# Patient Record
Sex: Female | Born: 1950 | Race: White | Hispanic: No | Smoking: Current some day smoker
Health system: Southern US, Community
[De-identification: ages and names within clinical notes are randomized; demographics above are authoritative.]

## PROBLEM LIST (undated history)

## (undated) ENCOUNTER — Emergency Department: Admission: EM | Payer: Medicare Other | Source: Home / Self Care

## (undated) DIAGNOSIS — K219 Gastro-esophageal reflux disease without esophagitis: Secondary | ICD-10-CM

## (undated) DIAGNOSIS — I10 Essential (primary) hypertension: Secondary | ICD-10-CM

## (undated) DIAGNOSIS — E785 Hyperlipidemia, unspecified: Secondary | ICD-10-CM

## (undated) DIAGNOSIS — M1990 Unspecified osteoarthritis, unspecified site: Secondary | ICD-10-CM

## (undated) DIAGNOSIS — I2510 Atherosclerotic heart disease of native coronary artery without angina pectoris: Secondary | ICD-10-CM

## (undated) DIAGNOSIS — C801 Malignant (primary) neoplasm, unspecified: Secondary | ICD-10-CM

## (undated) HISTORY — PX: TOTAL KNEE ARTHROPLASTY: SHX125

## (undated) HISTORY — PX: CHOLECYSTECTOMY: SHX55

## (undated) HISTORY — PX: APPENDECTOMY: SHX54

## (undated) HISTORY — PX: CARDIAC CATHETERIZATION: SHX172

## (undated) NOTE — Telephone Encounter (Signed)
Formatting of this note might be different from the original.  I attempted to contact the patient to schedule their Medicare Annual Wellness Visit (AWV) virtually, by telephone, or face-to-face. No answer, voicemail not set up.     Donnie Mesa, [place]  [phone]- [phone]  Electronically signed by Wilson Singer, CMA at 12/04/2022 12:59 PM EST

---

## 2014-05-05 ENCOUNTER — Ambulatory Visit (INDEPENDENT_AMBULATORY_CARE_PROVIDER_SITE_OTHER): Payer: Federal, State, Local not specified - PPO | Admitting: Family Medicine

## 2014-05-05 ENCOUNTER — Ambulatory Visit (INDEPENDENT_AMBULATORY_CARE_PROVIDER_SITE_OTHER)
Admission: RE | Admit: 2014-05-05 | Discharge: 2014-05-05 | Disposition: A | Discharge status: Home / Self Care | Payer: Federal, State, Local not specified - PPO | Source: Ambulatory Visit | Attending: Family Medicine | Admitting: Family Medicine

## 2014-05-05 VITALS — BP 122/82 | HR 68 | Wt 167.0 lb

## 2014-05-05 DIAGNOSIS — M549 Dorsalgia, unspecified: Secondary | ICD-10-CM

## 2014-05-05 DIAGNOSIS — M545 Low back pain, unspecified: Secondary | ICD-10-CM | POA: Insufficient documentation

## 2014-05-05 NOTE — Assessment & Plan Note (Signed)
Patient's low back pain is likely multifactorial. Patient does have poor core strengthening is overweight, we also discussed patient having some mild degenerative disc disease. Patient given home exercise program, over-the-counter medications he can be beneficial. Patient is getting narcotics from another provider and we discussed her to limit this as much as possible. We discussed an icing protocol as well. Patient will try these interventions and then come back again in 3 weeks. Patient continues to have trouble we can either consider advanced imaging or osteopathic manipulation. We'll discuss further at next evaluation.

## 2014-05-05 NOTE — Patient Instructions (Addendum)
Very nice to meet you Try exercises most days of the week.  Ice 20 minutes after activity.  Take tylenol 650 mg three times a day is the best evidence based medicine we have for arthritis.  Glucosamine sulfate 750mg  twice a day is a supplement that has been shown to help moderate to severe arthritis. Vitamin D 2000 IU daily Fish oil 2 grams daily.  Tumeric 500mg  twice daily.  Capsaicin topically up to four times a day may also help with pain. It's important that you continue to stay active. Controlling your weight is important.  Consider physical therapy to strengthen muscles around the joint that hurts to take pressure off of the joint itself. Water aerobics and cycling with low resistance are the best two types of exercise for arthritis. Come back and see me in 3-4 weeks.

## 2014-05-05 NOTE — Progress Notes (Signed)
[facility] [address] [place], [place] [address] Phone: [phone] Subjective:    I'm seeing this patient by the request  of:  Leward Quan MD  CC: Low back pain  [id number] Kelly Sanchez is a 63 y.o. female coming in with complaint of low back pain. Patient states she has had this pain for multiple years but has gotten worse with increasing frequency and duration over the course of the last couple months. Patient states that it is difficult standing at work for a long period of time secondary to the pain. Patient states that she starts moving she actually feels better overall. Patient denies any significant radiation into her legs but states that she can have a tingling feeling in her right leg if she stands for too long. Patient denies any weakness wearing complete numbness of the legs. Patient states that the pain can give her trouble she tries to extend her back such as lying down at night. Patient has tried some over-the-counter medicines as well as Percocet that she gets from another provider that does help at night. Patient rates the severity of 9/10 but is able to still do all her activities of daily living as well as work. No history of any true injury to this area. Patient though does have a past medical history significant for cervical disc disease status post epidural injections.   X-rays were ordered with the and interpreted by me today. X-rays the patient's lumbar spine did not show anything other than some mild degenerative disc disease. Over read pending    Past medical history, social, surgical and family history all reviewed in electronic medical record.   Review of Systems: No headache, visual changes, nausea, vomiting, diarrhea, constipation, dizziness, abdominal pain, skin rash, fevers, chills, night sweats, weight loss, swollen lymph nodes, body aches, joint swelling, muscle aches, chest pain, shortness of breath, mood changes.    Objective Blood pressure 122/82, pulse 68, weight 167 lb (75.751 kg), SpO2 97.00%.  General: No apparent distress alert and oriented x3 mood and affect normal, dressed appropriately. Obese.  HEENT: Pupils equal, extraocular movements intact  Respiratory: Patient's speak in full sentences and does not appear short of breath  Cardiovascular: No lower extremity edema, non tender, no erythema  Skin: Warm dry intact with no signs of infection or rash on extremities or on axial skeleton.  Abdomen: Soft nontender  Neuro: Cranial nerves II through XII are intact, neurovascularly intact in all extremities with 2+ DTRs and 2+ pulses.  Lymph: No lymphadenopathy of posterior or anterior cervical chain or axillae bilaterally.  Gait normal with good balance and coordination.  MSK:  Non tender with full range of motion and good stability and symmetric strength and tone of shoulders, elbows, wrist, hip, knee and ankles bilaterally.  Back Exam:  Inspection: Unremarkable  Motion: Flexion 35 deg, Extension 35 deg, Side Bending to 45 deg bilaterally,  Rotation to 45 deg bilaterally  SLR laying: Negative  XSLR laying: Negative  Palpable tenderness: Mild tenderness mostly over the right-sided paraspinal musculature from L2-L4.Marland Kitchen FABER: negative. Sensory change: Gross sensation intact to all lumbar and sacral dermatomes.  Reflexes: 2+ at both patellar tendons, 2+ at achilles tendons, Babinski's downgoing.  Strength at foot  Plantar-flexion: 5/5 Dorsi-flexion: 5/5 Eversion: 5/5 Inversion: 5/5  Leg strength  Quad: 5/5 Hamstring: 5/5 Hip flexor: 5/5 Hip abductors: 5/5  Gait unremarkable.     Impression and Recommendations:     This case required medical decision making  of moderate complexity.

## 2014-05-08 ENCOUNTER — Emergency Department: Payer: Self-pay | Admitting: Emergency Medicine

## 2014-05-08 LAB — COMPREHENSIVE METABOLIC PANEL
Albumin: 4.1 g/dL (ref 3.4–5.0)
Alkaline Phosphatase: 101 U/L
Anion Gap: 5 — ABNORMAL LOW (ref 7–16)
BUN: 15 mg/dL (ref 7–18)
Bilirubin,Total: 0.4 mg/dL (ref 0.2–1.0)
Calcium, Total: 9.4 mg/dL (ref 8.5–10.1)
Chloride: 109 mmol/L — ABNORMAL HIGH (ref 98–107)
Co2: 27 mmol/L (ref 21–32)
Creatinine: 1.17 mg/dL (ref 0.60–1.30)
EGFR (African American): 58 — ABNORMAL LOW
EGFR (Non-African Amer.): 50 — ABNORMAL LOW
Glucose: 94 mg/dL (ref 65–99)
Osmolality: 282 (ref 275–301)
Potassium: 4.3 mmol/L (ref 3.5–5.1)
SGOT(AST): 24 U/L (ref 15–37)
SGPT (ALT): 38 U/L (ref 12–78)
Sodium: 141 mmol/L (ref 136–145)
Total Protein: 7.7 g/dL (ref 6.4–8.2)

## 2014-05-08 LAB — CBC WITH DIFFERENTIAL/PLATELET
Basophil #: 0.1 10*3/uL (ref 0.0–0.1)
Basophil %: 0.9 %
Eosinophil #: 0.1 10*3/uL (ref 0.0–0.7)
Eosinophil %: 1.7 %
HCT: 40.8 % (ref 35.0–47.0)
HGB: 13.8 g/dL (ref 12.0–16.0)
Lymphocyte #: 3.7 10*3/uL — ABNORMAL HIGH (ref 1.0–3.6)
Lymphocyte %: 42.8 %
MCH: 30.1 pg (ref 26.0–34.0)
MCHC: 33.9 g/dL (ref 32.0–36.0)
MCV: 89 fL (ref 80–100)
Monocyte #: 0.5 x10 3/mm (ref 0.2–0.9)
Monocyte %: 5.2 %
Neutrophil #: 4.3 10*3/uL (ref 1.4–6.5)
Neutrophil %: 49.4 %
Platelet: 227 10*3/uL (ref 150–440)
RBC: 4.59 10*6/uL (ref 3.80–5.20)
RDW: 14.7 % — ABNORMAL HIGH (ref 11.5–14.5)
WBC: 8.7 10*3/uL (ref 3.6–11.0)

## 2014-05-08 LAB — URINALYSIS, COMPLETE
Bacteria: NONE SEEN
Bilirubin,UR: NEGATIVE
Glucose,UR: NEGATIVE mg/dL (ref 0–75)
Hyaline Cast: 2
Ketone: NEGATIVE
Nitrite: NEGATIVE
Ph: 5 (ref 4.5–8.0)
Protein: NEGATIVE
RBC,UR: 1 /HPF (ref 0–5)
Specific Gravity: 1.015 (ref 1.003–1.030)
Squamous Epithelial: 1
WBC UR: 2 /HPF (ref 0–5)

## 2014-05-08 LAB — LIPASE, BLOOD: Lipase: 474 U/L — ABNORMAL HIGH (ref 73–393)

## 2014-05-26 ENCOUNTER — Ambulatory Visit: Payer: Federal, State, Local not specified - PPO | Admitting: Family Medicine

## 2014-05-26 DIAGNOSIS — Z0289 Encounter for other administrative examinations: Secondary | ICD-10-CM

## 2015-04-26 ENCOUNTER — Emergency Department: Payer: Federal, State, Local not specified - PPO

## 2015-04-26 ENCOUNTER — Encounter: Payer: Self-pay | Admitting: Emergency Medicine

## 2015-04-26 ENCOUNTER — Emergency Department
Admission: EM | Admit: 2015-04-26 | Discharge: 2015-04-26 | Disposition: A | Discharge status: Home / Self Care | Payer: Federal, State, Local not specified - PPO | Attending: Emergency Medicine | Admitting: Emergency Medicine

## 2015-04-26 DIAGNOSIS — M7121 Synovial cyst of popliteal space [Baker], right knee: Secondary | ICD-10-CM

## 2015-04-26 DIAGNOSIS — R2241 Localized swelling, mass and lump, right lower limb: Secondary | ICD-10-CM | POA: Diagnosis present

## 2015-04-26 DIAGNOSIS — R609 Edema, unspecified: Secondary | ICD-10-CM

## 2015-04-26 HISTORY — DX: Malignant (primary) neoplasm, unspecified: C80.1

## 2015-04-26 HISTORY — DX: Unspecified osteoarthritis, unspecified site: M19.90

## 2015-04-26 MED ORDER — TRAMADOL HCL 50 MG PO TABS: 50.0000 mg | ORAL_TABLET | Freq: Four times a day (QID) | ORAL | Status: AC | PRN

## 2015-04-26 NOTE — ED Notes (Signed)
Pt signed d/c instructions due to NO WRITING PAD in treatment room 30.

## 2015-04-26 NOTE — ED Notes (Signed)
Pt in with co right lower leg pain since yest sent from urgent care to rule out dvt

## 2015-04-26 NOTE — Discharge Instructions (Signed)
Gentzler Cyst °A Meinders cyst is a sac-like structure that forms in the back of the knee. It is filled with the same fluid that is located in your knee. This fluid lubricates the bones and cartilage of the knee and allows them to move over each other more easily. °CAUSES  °When the knee becomes injured or inflamed, increased fluid forms in the knee. When this happens, the joint lining is pushed out behind the knee and forms the Muto cyst. This cyst may also be caused by inflammation from arthritic conditions and infections. °SIGNS AND SYMPTOMS  °A Engelbrecht cyst usually has no symptoms. When the cyst is substantially enlarged: °· You may feel pressure behind the knee, stiffness in the knee, or a mass in the area behind the knee. °· You may develop pain, redness, and swelling in the calf.  This can suggest a blood clot and requires evaluation by your health care provider. °DIAGNOSIS  °A Laube cyst is most often found during an ultrasound exam. This exam may have been performed for other reasons, and the cyst was found incidentally. Sometimes an MRI is used. This picks up other problems within a joint that an ultrasound exam may not. If the Kaufman cyst developed immediately after an injury, X-ray exams may be used to diagnose the cyst. °TREATMENT  °The treatment depends on the cause of the cyst. Anti-inflammatory medicines and rest often will be prescribed. If the cyst is caused by a bacterial infection, antibiotic medicines may be prescribed.  °HOME CARE INSTRUCTIONS  °· If the cyst was caused by an injury, for the first 24 hours, keep the injured leg elevated on 2 pillows while lying down. °· For the first 24 hours while you are awake, apply ice to the injured area: °¨ Put ice in a plastic bag. °¨ Place a towel between your skin and the bag. °¨ Leave the ice on for 20 minutes, 2-3 times a day. °· Only take over-the-counter or prescription medicines for pain, discomfort, or fever as directed by your health care  provider. °· Only take antibiotic medicine as directed. Make sure to finish it even if you start to feel better. °MAKE SURE YOU:  °· Understand these instructions. °· Will watch your condition. °· Will get help right away if you are not doing well or get worse. °Document Released: 12/11/2005 Document Revised: 10/01/2013 Document Reviewed: 07/23/2013 °ExitCare® Patient Information ©2015 [employer]. This information is not intended to replace advice given to you by your health care provider. Make sure you discuss any questions you have with your health care provider. ° °

## 2015-04-26 NOTE — ED Notes (Signed)
Patient with no complaints at this time. Respirations even and unlabored. Skin warm/dry. Discharge instructions reviewed with patient at this time. Patient given opportunity to voice concerns/ask questions. Patient discharged at this time and left Emergency Department with steady gait.   

## 2015-04-26 NOTE — ED Provider Notes (Signed)
[facility] Emergency Department Provider Note   Time seen: 9:48 PM  I have reviewed the triage vital signs and the nursing notes.   HISTORY  Chief Complaint Leg Swelling    HPI Kelly Sanchez is a 64 y.o. female who presents with right lower leg pain and was sent from urgent care to rule out DVT. Get an ultrasound she was sent here she's gross pain as severe earlier mild currently. 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 Pain is mostly on the right knee is dull     No past medical history on file.  Patient Active Problem List   Diagnosis Date Noted  . Low back pain 05/05/2014    No past surgical history on file.  No current outpatient prescriptions on file.  Allergies Review of patient's allergies indicates no known allergies.  No family history on file.  Social History History  Substance Use Topics  . Smoking status: Not on file  . Smokeless tobacco: Not on file  . Alcohol Use: Not on file    Review of Systems Constitutional: Negative for fever. Eyes: Negative for visual changes. ENT: Negative for sore throat. Cardiovascular: Negative for chest pain. Respiratory: Negative for shortness of breath. Gastrointestinal: Negative for abdominal pain, vomiting and diarrhea. Genitourinary: Negative for dysuria. Musculoskeletal: Negative for back pain. Skin: Negative for rash. Neurological: Negative for headaches, focal weakness or numbness.  10-point ROS otherwise negative.  ____________________________________________   PHYSICAL EXAM:  VITAL SIGNS: ED Triage Vitals  Enc Vitals Group     BP 04/26/15 2023 132/75 mmHg     Pulse Rate 04/26/15 2023 72     Resp --      Temp 04/26/15 2023 97.9 F (36.6 C)     Temp Source 04/26/15 2023 Oral     SpO2 04/26/15 2023 97 %     Weight 04/26/15 2017 177 lb (80.287 kg)     Height 04/26/15 2017 5\' 2"  (1.575 m)     Head Cir --      Peak Flow --      Pain Score 04/26/15 2017 10   Pain Loc --      Pain Edu? --      Excl. in [place]? --     Constitutional: Alert and oriented. Well appearing and in no distress.  ENT   Head: Normocephalic and atraumatic.    Mouth/Throat: Mucous membranes are moist.   Neck: No stridor.   Gastrointestinal: Soft and nontender. No distention. No abdominal bruits. There is no CVA tenderness.  Musculoskeletal: Nontender with normal range of motion in all extremities. No joint effusions.  No lower extremity tenderness nor edema. Neurologic:  Normal speech and language. No gross focal neurologic deficits are appreciated. Speech is normal. No gait instability. Skin:  Skin is warm, dry and intact. No rash noted. Psychiatric: Mood and affect are normal. Speech and behavior are normal. Patient exhibits appropriate insight and judgment.        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  Impression Callender's cyst  Next  Plan would be NSAIDs and orthopedics follow-up. Patient presents plan stable for discharge   Earleen Newport,  MD   Earleen Newport, MD 04/26/15 2151

## 2019-01-16 ENCOUNTER — Encounter: Payer: Self-pay | Admitting: Orthopaedic Surgery

## 2019-01-16 ENCOUNTER — Ambulatory Visit (INDEPENDENT_AMBULATORY_CARE_PROVIDER_SITE_OTHER): Payer: Self-pay | Admitting: Orthopaedic Surgery

## 2019-01-16 VITALS — BP 147/89 | HR 85 | Ht 62.5 in | Wt 146.0 lb

## 2019-01-16 DIAGNOSIS — Z96652 Presence of left artificial knee joint: Secondary | ICD-10-CM

## 2019-01-16 NOTE — Progress Notes (Signed)
Patient seen for impairment rating recorded elsewhere.

## 2019-10-09 ENCOUNTER — Emergency Department: Payer: Medicare Other

## 2019-10-09 ENCOUNTER — Emergency Department
Admission: EM | Admit: 2019-10-09 | Discharge: 2019-10-09 | Disposition: A | Discharge status: Home / Self Care | Payer: Medicare Other | Attending: Emergency Medicine | Admitting: Emergency Medicine

## 2019-10-09 ENCOUNTER — Encounter: Payer: Self-pay | Admitting: Emergency Medicine

## 2019-10-09 ENCOUNTER — Other Ambulatory Visit: Payer: Self-pay

## 2019-10-09 DIAGNOSIS — Y939 Activity, unspecified: Secondary | ICD-10-CM | POA: Insufficient documentation

## 2019-10-09 DIAGNOSIS — Z79899 Other long term (current) drug therapy: Secondary | ICD-10-CM | POA: Insufficient documentation

## 2019-10-09 DIAGNOSIS — F17210 Nicotine dependence, cigarettes, uncomplicated: Secondary | ICD-10-CM | POA: Insufficient documentation

## 2019-10-09 DIAGNOSIS — Y999 Unspecified external cause status: Secondary | ICD-10-CM | POA: Diagnosis not present

## 2019-10-09 DIAGNOSIS — Y929 Unspecified place or not applicable: Secondary | ICD-10-CM | POA: Diagnosis not present

## 2019-10-09 DIAGNOSIS — W010XXA Fall on same level from slipping, tripping and stumbling without subsequent striking against object, initial encounter: Secondary | ICD-10-CM | POA: Diagnosis not present

## 2019-10-09 DIAGNOSIS — S299XXA Unspecified injury of thorax, initial encounter: Secondary | ICD-10-CM | POA: Diagnosis not present

## 2019-10-09 MED ORDER — KETOROLAC TROMETHAMINE 30 MG/ML IJ SOLN: 30.0000 mg | Freq: Once | INTRAMUSCULAR | Status: DC

## 2019-10-09 MED ORDER — OXYCODONE-ACETAMINOPHEN 5-325 MG PO TABS: 1.0000 | ORAL_TABLET | ORAL | 0 refills | Status: DC | PRN

## 2019-10-09 MED ORDER — OXYCODONE-ACETAMINOPHEN 5-325 MG PO TABS
1.0000 | ORAL_TABLET | Freq: Once | ORAL | Status: AC
Start: 2019-10-09 — End: 2019-10-09
  Administered 2019-10-09: 1 via ORAL
  Filled 2019-10-09: qty 1

## 2019-10-09 NOTE — ED Notes (Signed)
Incentive spirometer provided for patient with education on proper use.

## 2019-10-09 NOTE — ED Triage Notes (Signed)
Patient ambulatory to triage with steady gait, without difficulty or distress noted, mask in place; pt reports slipping on porch PTA; c/o pain to rt lateral ribcage; denies any other c/o or injuries

## 2019-10-09 NOTE — ED Provider Notes (Signed)
[facility] Emergency Department Provider Note    First MD Initiated Contact with Patient 10/09/19 0320     (approximate)  I have reviewed the triage vital signs and the nursing notes.   HISTORY  Chief Complaint Rib Injury   HPI Kelly Sanchez is a 68 y.o. female presents to the emergency department history of accidental slip and fall with resultant right lateral chest wall discomfort.  Patient states that she felt a "pop" her ribs when she fell.  Patient admits to pain with palpation and deep inspiration.  Patient denies any head injury no loss of consciousness.        Past Medical History:  Diagnosis Date  . Arthritis   . Cancer [facility])     Patient Active Problem List   Diagnosis Date Noted  . Low back pain 05/05/2014    Past Surgical History:  Procedure Laterality Date  . APPENDECTOMY    . CHOLECYSTECTOMY      Prior to Admission medications   Medication Sig Start Date End Date Taking? Authorizing Provider  aspirin 81 MG chewable tablet Chew by mouth.    [provider]  atorvastatin (LIPITOR) 80 MG tablet Take by mouth. 09/04/18   [provider]  gabapentin (NEURONTIN) 300 MG capsule Take by mouth. 09/04/18 09/04/19  [provider]  metoprolol succinate (TOPROL-XL) 25 MG 24 hr tablet Take by mouth. 09/04/18   [provider]  oxyCODONE-acetaminophen (PERCOCET) 5-325 MG tablet Take 1 tablet by mouth every 4 (four) hours as needed. 10/09/19 10/08/20  Gregor Hams, MD  pantoprazole (PROTONIX) 40 MG tablet Take by mouth. 12/27/18 12/27/19  [provider]    Allergies Patient has no known allergies.  Family History  Problem Relation Age of Onset  . Dementia Mother   . Heart attack Father     Social History Social History   Tobacco Use  . Smoking status: Current Every Day Smoker  . Smokeless tobacco: Never Used  Substance Use Topics  . Alcohol use: No  . Drug use: No    Review of  Systems Constitutional: No fever/chills Eyes: No visual changes. ENT: No sore throat. Cardiovascular: Denies chest pain. Respiratory: Denies shortness of breath. Gastrointestinal: No abdominal pain.  No nausea, no vomiting.  No diarrhea.  No constipation. Genitourinary: Negative for dysuria. Musculoskeletal: Negative for neck pain.  Negative for back pain. Integumentary: Negative for rash. Neurological: Negative for headaches, focal weakness or numbness.   ____________________________________________   PHYSICAL EXAM:  VITAL SIGNS: ED Triage Vitals [10/09/19 0253]  Enc Vitals Group     BP 140/75     Pulse Rate 86     Resp 18     Temp 98 F (36.7 C)     Temp Source Oral     SpO2 95 %     Weight 61.2 kg (135 lb)     Height 1.575 m (5\' 2" )     Head Circumference      Peak Flow      Pain Score 7     Pain Loc      Pain Edu?      Excl. in [place]?     Constitutional: Alert and oriented.  Eyes: Conjunctivae are normal.  Head: Atraumatic. Mouth/Throat: Mucous membranes are moist. Neck: No stridor.  No meningeal signs.   Chest: Pain to palpation of the right fifth sixth ribs laterally.   Cardiovascular: Normal rate, regular rhythm. Good peripheral circulation. Grossly normal heart sounds. Respiratory: Normal  respiratory effort.  No retractions. Gastrointestinal: Soft and nontender. No distention.  Musculoskeletal: No lower extremity tenderness nor edema. No gross deformities of extremities. Neurologic:  Normal speech and language. No gross focal neurologic deficits are appreciated.  Skin:  Skin is warm, dry and intact. Psychiatric: Mood and affect are normal. Speech and behavior are normal.   RADIOLOGY I, [place], personally viewed and evaluated these images (plain radiographs) as part of my medical decision making, as well as reviewing the written report by the radiologist.  ED MD interpretation: Negative right rib and chest x-ray per radiologist  Official  radiology report(s): Dg Ribs Unilateral W/chest Right  Result Date: 10/09/2019 CLINICAL DATA:  Fall EXAM: RIGHT RIBS AND CHEST - 3+ VIEW COMPARISON:  None. FINDINGS: No fracture or other bone lesions are seen involving the ribs. There is no evidence of pneumothorax or pleural effusion. Both lungs are clear. Heart size and mediastinal contours are within normal limits. IMPRESSION: Negative. Electronically Signed   By: Donavan Foil M.D.   On: 10/09/2019 03:13    Procedures   ____________________________________________   INITIAL IMPRESSION / MDM / [place] / ED COURSE  As part of my medical decision making, I reviewed the following data within the [place] NUMBER   68 year old female presented with above-stated history and physical exam concerning for possible rib fracture.  X-ray did not reveal any evidence of fracture however clinical exam consistent with possible fracture.  Patient given Percocet in the emergency department will be prescribed the same for home.  Patient also given an incentive spirometer.  Spoke with the patient at length regarding warning signs that would warrant immediate return to the emergency department.  ____________________________________________  FINAL CLINICAL IMPRESSION(S) / ED DIAGNOSES  Final diagnoses:  Traumatic injury of rib     MEDICATIONS GIVEN DURING THIS VISIT:  Medications  ketorolac (TORADOL) 30 MG/ML injection 30 mg (30 mg Intramuscular Not Given 10/09/19 0345)  oxyCODONE-acetaminophen (PERCOCET/ROXICET) 5-325 MG per tablet 1 tablet (1 tablet Oral Given 10/09/19 0356)     ED Discharge Orders         Ordered    oxyCODONE-acetaminophen (PERCOCET) 5-325 MG tablet  Every 4 hours PRN     10/09/19 0343          *Please note:  Kelly Sanchez was evaluated in Emergency Department on 10/09/2019 for the symptoms described in the history of present illness. She was evaluated in the context of the global COVID-19 pandemic,  which necessitated consideration that the patient might be at risk for infection with the SARS-CoV-2 virus that causes COVID-19. Institutional protocols and algorithms that pertain to the evaluation of patients at risk for COVID-19 are in a state of rapid change based on information released by regulatory bodies including the CDC and federal and state organizations. These policies and algorithms were followed during the patient's care in the ED.  Some ED evaluations and interventions may be delayed as a result of limited staffing during the pandemic.*  Note:  This document was prepared using Dragon voice recognition software and may include unintentional dictation errors.   Gregor Hams, MD 10/09/19 [phone]

## 2019-11-25 ENCOUNTER — Other Ambulatory Visit: Payer: Self-pay

## 2019-11-25 DIAGNOSIS — Z20822 Contact with and (suspected) exposure to covid-19: Secondary | ICD-10-CM

## 2019-11-27 LAB — NOVEL CORONAVIRUS, NAA: SARS-CoV-2, NAA: NOT DETECTED

## 2019-12-09 ENCOUNTER — Ambulatory Visit
Admission: EM | Admit: 2019-12-09 | Discharge: 2019-12-09 | Disposition: A | Discharge status: Home / Self Care | Payer: Medicare Other | Attending: Urgent Care | Admitting: Urgent Care

## 2019-12-09 ENCOUNTER — Other Ambulatory Visit: Payer: Self-pay

## 2019-12-09 ENCOUNTER — Ambulatory Visit: Payer: Medicare Other

## 2019-12-09 DIAGNOSIS — R0781 Pleurodynia: Secondary | ICD-10-CM | POA: Diagnosis present

## 2019-12-09 DIAGNOSIS — R05 Cough: Secondary | ICD-10-CM | POA: Insufficient documentation

## 2019-12-09 DIAGNOSIS — J069 Acute upper respiratory infection, unspecified: Secondary | ICD-10-CM

## 2019-12-09 DIAGNOSIS — R059 Cough, unspecified: Secondary | ICD-10-CM

## 2019-12-09 MED ORDER — HYDROCOD POLST-CPM POLST ER 10-8 MG/5ML PO SUER: 5.0000 mL | Freq: Two times a day (BID) | ORAL | 0 refills | Status: DC | PRN

## 2019-12-09 NOTE — ED Provider Notes (Signed)
[place]   Name: Kelly Sanchez DOB: 08-Dec-1951 MRN: [id number] CSN: [id number] PCP: Joanie Coddington, MD  Arrival date and time:  12/09/19 1336  Chief Complaint:  Cough   NOTE: Prior to seeing the patient today, I have reviewed the triage nursing documentation and vital signs. Clinical staff has updated patient's PMH/PSHx, current medication list, and drug allergies/intolerances to ensure comprehensive history available to assist in medical decision making.   History:   HPI: Kelly Sanchez is a 68 y.o. female who presents today with complaints of cough and pleuritic chest pain that started a few days ago. Patient denies fevers. Cough is productive of clear sputum.Patient currently on course of amoxicillin-clavulanate for a sinus infection that prescribed by her PCP; started on 12/02/2019. Patient reports that sinus symptoms have resolved, however she feels as if the head congestion has moved down into her chest. Cough is worse at night and is causing her to experience sleep disturbances. She denies any shortness of breath or wheezing. She denies that she has experienced any nausea, vomiting, diarrhea, or abdominal pain. She is eating and drinking well. Patient denies any perceived alterations to her sense of taste or smell. Patient denies being in close contact with anyone known to be ill; no one else is her home has experienced a similar symptom constellation. She has not been tested for SARS-CoV-2 (novel coronavirus) in the past 14 days; last tested negative 2 weeks ago per her report. Patient does not wish to be re-tested today. In efforts to conservatively manage her symptoms at home, the patient notes that she has used Robitussin, which has not helped to improve her symptoms.    Past Medical History:  Diagnosis Date  . Arthritis   . Cancer [facility])     Past Surgical History:  Procedure Laterality Date  . APPENDECTOMY    . CHOLECYSTECTOMY      Family History  Problem Relation Age of  Onset  . Dementia Mother   . Heart attack Father     Social History   Tobacco Use  . Smoking status: Current Every Day Smoker    Packs/day: 0.50    Types: Cigarettes  . Smokeless tobacco: Never Used  Substance Use Topics  . Alcohol use: No  . Drug use: No    Patient Active Problem List   Diagnosis Date Noted  . Low back pain 05/05/2014    Home Medications:    Current Meds  Medication Sig  . amoxicillin-clavulanate (AUGMENTIN) 500-125 MG tablet Take 1 tablet by mouth 2 (two) times daily.  Marland Kitchen aspirin 81 MG chewable tablet Chew by mouth.  Marland Kitchen atorvastatin (LIPITOR) 80 MG tablet Take by mouth.  . pantoprazole (PROTONIX) 40 MG tablet Take by mouth.    Allergies:   Patient has no known allergies.  Review of Systems (ROS): Review of Systems  Constitutional: Positive for fatigue. Negative for fever.  HENT: Negative for congestion, ear pain, postnasal drip, rhinorrhea, sinus pressure, sinus pain, sneezing and sore throat.   Eyes: Negative for pain, discharge and redness.  Respiratory: Positive for cough. Negative for chest tightness and shortness of breath.   Cardiovascular: Positive for chest pain (pleuritic). Negative for palpitations.  Gastrointestinal: Negative for abdominal pain, diarrhea, nausea and vomiting.  Musculoskeletal: Negative for arthralgias, back pain, myalgias and neck pain.  Skin: Negative for color change, pallor and rash.  Neurological: Negative for dizziness, syncope, weakness and headaches.  Hematological: Negative for adenopathy.     Vital Signs: Today's Vitals   12/09/19  1359 12/09/19 1401 12/09/19 1459  BP:  120/73   Pulse:  89   Resp:  17   Temp:  98.2 F (36.8 C)   TempSrc:  Oral   SpO2:  98%   Weight: 134 lb (60.8 kg)    Height: 5\' 2"  (1.575 m)    PainSc: 6   6     Physical Exam: Physical Exam  Constitutional: She is oriented to person, place, and time and well-developed, well-nourished, and in no distress.  HENT:  Head:  Normocephalic and atraumatic.  Nose: Mucosal edema (mild) present. No rhinorrhea or sinus tenderness.  Mouth/Throat: Uvula is midline and mucous membranes are normal. Posterior oropharyngeal erythema (mild) present. No oropharyngeal exudate or posterior oropharyngeal edema.  Eyes: Pupils are equal, round, and reactive to light.  Cardiovascular: Normal rate, regular rhythm, normal heart sounds and intact distal pulses.  Pulmonary/Chest: Effort normal. She has rhonchi (upper airways; clears with cough).  Deep cough in clinic. No increased WOB or distress. SPO2 98% on RA. Pain in LEFT anterior and lateral chest wall that is reproducible with deep inspiration only.  Neurological: She is alert and oriented to person, place, and time. Gait normal.  Skin: Skin is warm and dry. No rash noted. She is not diaphoretic.  Psychiatric: Mood, memory, affect and judgment normal.  Nursing note and vitals reviewed.   Urgent Care Treatments / Results:  LABS: PLEASE NOTE: all labs that were ordered this encounter are listed, however only abnormal results are displayed. Labs Reviewed - No data to display  EKG: -None  RADIOLOGY: DG Chest 2 View  Result Date: 12/09/2019 CLINICAL DATA:  Chest congestion, pleuritic chest pain EXAM: CHEST - 2 VIEW COMPARISON:  10/09/2019 FINDINGS: Cardiomediastinal contours are normal. Lungs are clear. No signs of pleural effusion. No acute acute bone finding. IMPRESSION: No acute cardiopulmonary disease. Electronically Signed   By: Zetta Bills M.D.   On: 12/09/2019 14:30    PROCEDURES: Procedures  MEDICATIONS RECEIVED THIS VISIT: Medications - No data to display  PERTINENT CLINICAL COURSE NOTES/UPDATES:   Initial Impression / Assessment and Plan / Urgent Care Course:  Pertinent labs & imaging results that were available during my care of the patient were personally reviewed by me and considered in my medical decision making (see lab/imaging section of note for values  and interpretations).  Kelly Sanchez is a 68 y.o. female who presents to [facility] today with complaints of Cough   Patient overall well appearing and in no acute distress today in clinic. Presenting symptoms (see HPI) and exam as documented above. Patient with deep cough. No SOB or wheezing. Sinus symptoms improved on current amoxicillin-clavulanate course. Chest congestion and cough worsening over the last few days. SARS-CoV-2 (novel coronavirus) negative 2 weeks ago. Patient does not wishes to be re-tested today. Radiographs of the chest performed revealed no acute cardiopulmonary process; no evidence of peribronchial thickening, areas of consolidation, or focal infiltrates. Patient to complete current course of antibiotics. Given her cough and pleuritic chest pain, will provide patient with a short course of Tussionex to help with her cough. Discussed indications for use and need to exercise caution while using this medication as it can cause her to become somnolent. Discussed supportive care measures at home during acute phase of illness. Patient to rest as much as possible. She was encouraged to ensure adequate hydration (water and ORS) to prevent dehydration and electrolyte derangements. Patient may use APAP and/or IBU on an as needed basis for pain/fever.  Current clinical condition warrants patient being out of work in order to recover from her current injury/illness. She was provided with the appropriate documentation to provide to her place of employment that will allow for her to RTW on 12/12/2019 with no restrictions.   Discussed follow up with primary care physician in 1 week for re-evaluation. I have reviewed the follow up and strict return precautions for any new or worsening symptoms. Patient is aware of symptoms that would be deemed urgent/emergent, and would thus require further evaluation either here or in the emergency department. At the time of discharge, she verbalized  understanding and consent with the discharge plan as it was reviewed with her. All questions were fielded by provider and/or clinic staff prior to patient discharge.    Final Clinical Impressions / Urgent Care Diagnoses:   Final diagnoses:  Acute upper respiratory infection  Cough  Pleuritic chest pain    New Prescriptions:  [place] Controlled Substance Registry consulted? Yes, I have consulted the [place] Controlled Substances Registry for this patient, and feel the risk/benefit ratio today is favorable for proceeding with this prescription for a controlled substance.  . Discussed use of controlled substance medication to treat her acute symptoms.  o Reviewed [place] STOP Act regulations  o Clinic does not refill controlled substances over the phone without face to face evaluation.  . Safety precautions reviewed.  o Medications should not be sold or taken with alcohol.  o Avoid use while working, driving, or operating heavy machinery.  o Side effects associated with the use of this particular medication reviewed. - Patient understands that this medication can cause CNS depression, increase her risk of falls, and even lead to overdose that may result in death, if used outside of the parameters that she and I discussed.  With all of this in mind, she knowingly accepts the risks and responsibilities associated with intended course of treatment, and elects to responsibly proceed as discussed.  Meds ordered this encounter  Medications  . chlorpheniramine-HYDROcodone (TUSSIONEX PENNKINETIC ER) 10-8 MG/5ML SUER    Sig: Take 5 mLs by mouth every 12 (twelve) hours as needed for cough.    Dispense:  70 mL    Refill:  0    Recommended Follow up Care:  Patient encouraged to follow up with the following provider within the specified time frame, or sooner as dictated by the severity of her symptoms. As always, she was instructed that for any urgent/emergent care needs, she should seek care either here or in the  emergency department for more immediate evaluation.  Follow-up Information    Joanie Coddington, MD In 1 week.   Specialty: Family Medicine Why: General reassessment of symptoms if not improving Contact information: [address] [place] [address] [phone]         NOTE: This note was prepared using Dragon dictation software along with smaller phrase technology. Despite my best ability to proofread, there is the potential that transcriptional errors may still occur from this process, and are completely unintentional.    Karen Kitchens, NP 12/09/19 1705

## 2019-12-09 NOTE — Discharge Instructions (Addendum)
It was very nice seeing you today in clinic. Thank you for entrusting me with your care.   Rest and Stay HYDRATED. Water and electrolyte containing beverages (Gatorade, Pedialyte) are best to prevent dehydration and electrolyte abnormalities. Use cough medication; do not take with other sedating medication; no dot drive after taking. May use Tylenol and/or Ibuprofen as needed for pain/fever.   Make arrangements to follow up with your regular doctor in 1 week for re-evaluation if not improving. If your symptoms/condition worsens, please seek follow up care either here or in the ER. Please remember, our [place] providers are "right here with you" when you need [place].   Again, it was my pleasure to take care of you today. Thank you for choosing our clinic. I hope that you start to feel better quickly.   Honor Loh, MSN, APRN, FNP-C, CEN Advanced Practice Provider [facility]

## 2019-12-09 NOTE — ED Triage Notes (Signed)
Patient states that she has been dealing with a sinus infection since the 12/04. Patient states that she has been on Augmentin since 12/08. Patient states that now her symptoms have settled in her chest and she is cough. Patient is concerned that she is worsening.

## 2021-05-17 ENCOUNTER — Emergency Department: Payer: Medicare Other

## 2021-05-17 ENCOUNTER — Emergency Department
Admission: EM | Admit: 2021-05-17 | Discharge: 2021-05-17 | Disposition: A | Discharge status: Home / Self Care | Payer: Medicare Other | Attending: Emergency Medicine | Admitting: Emergency Medicine

## 2021-05-17 ENCOUNTER — Encounter: Payer: Self-pay | Admitting: Emergency Medicine

## 2021-05-17 ENCOUNTER — Other Ambulatory Visit: Payer: Self-pay

## 2021-05-17 DIAGNOSIS — M545 Low back pain, unspecified: Secondary | ICD-10-CM | POA: Diagnosis not present

## 2021-05-17 DIAGNOSIS — W108XXA Fall (on) (from) other stairs and steps, initial encounter: Secondary | ICD-10-CM | POA: Insufficient documentation

## 2021-05-17 DIAGNOSIS — M79602 Pain in left arm: Secondary | ICD-10-CM

## 2021-05-17 DIAGNOSIS — S5012XA Contusion of left forearm, initial encounter: Secondary | ICD-10-CM | POA: Insufficient documentation

## 2021-05-17 DIAGNOSIS — Z7982 Long term (current) use of aspirin: Secondary | ICD-10-CM | POA: Diagnosis not present

## 2021-05-17 DIAGNOSIS — Y92039 Unspecified place in apartment as the place of occurrence of the external cause: Secondary | ICD-10-CM | POA: Diagnosis not present

## 2021-05-17 DIAGNOSIS — Z859 Personal history of malignant neoplasm, unspecified: Secondary | ICD-10-CM | POA: Insufficient documentation

## 2021-05-17 DIAGNOSIS — F17210 Nicotine dependence, cigarettes, uncomplicated: Secondary | ICD-10-CM | POA: Insufficient documentation

## 2021-05-17 DIAGNOSIS — S59912A Unspecified injury of left forearm, initial encounter: Secondary | ICD-10-CM | POA: Diagnosis present

## 2021-05-17 DIAGNOSIS — W19XXXA Unspecified fall, initial encounter: Secondary | ICD-10-CM

## 2021-05-17 MED ORDER — TIZANIDINE HCL 4 MG PO TABS: 4.0000 mg | ORAL_TABLET | Freq: Every day | ORAL | 0 refills | Status: AC

## 2021-05-17 MED ORDER — LIDOCAINE 5 % EX PTCH
1.0000 | MEDICATED_PATCH | CUTANEOUS | Status: DC
  Filled 2021-05-17: qty 1

## 2021-05-17 MED ORDER — TIZANIDINE HCL 4 MG PO TABS
4.0000 mg | ORAL_TABLET | Freq: Once | ORAL | Status: DC
  Filled 2021-05-17: qty 1

## 2021-05-17 MED ORDER — KETOROLAC TROMETHAMINE 60 MG/2ML IM SOLN
15.0000 mg | Freq: Once | INTRAMUSCULAR | Status: DC
  Filled 2021-05-17: qty 2

## 2021-05-17 NOTE — Discharge Instructions (Addendum)
Please use Tylenol, up to 1000 mg 4 times a day.  You may also apply lidocaine patches to your low back for pain.  You have been prescribed Zanaflex, a muscle relaxer to use up to 3 times daily as needed.  Please use caution when driving or operating machinery on this medication.  Please follow-up with primary care or return to the emergency department for any worsening.

## 2021-05-17 NOTE — ED Triage Notes (Signed)
First Nurse Note:  C/O falling yesterday.  C/O back pain and bruising.  AAOx3.  Skin warm and dry. NAD.  Ambulates with easy and steady gait.

## 2021-05-17 NOTE — ED Provider Notes (Signed)
[facility] Emergency Department Provider Note  ____________________________________________   Event Date/Time   First MD Initiated Contact with Patient 05/17/21 1748     (approximate)  I have reviewed the triage vital signs and the nursing notes.   HISTORY  Chief Complaint Fall   HPI Kelly Sanchez is a 70 y.o. female who presents to the emergency department for evaluation status post a fall that occurred 2 days ago.  Patient states that she lives on third floor of an apartment, was trying to go check the mail "by myself without my caregiver" and fell down approximately 5 stairs.  She does not believe that she hit her head but is not certain, denies loss of consciousness.  She reports that she was able to get herself back up, was able to get back into the apartment on her own.  She reports since that time she has noticed bruising and increased pain of her left forearm as well as her low back.  She has been ambulatory since that time, denies any loss of bowel or bladder control, denies any weakness down the extremities.  She is on an 81 mg aspirin per day, denies any other blood thinner use.       Past Medical History:  Diagnosis Date  . Arthritis   . Cancer [facility])     Patient Active Problem List   Diagnosis Date Noted  . Low back pain 05/05/2014    Past Surgical History:  Procedure Laterality Date  . APPENDECTOMY    . CHOLECYSTECTOMY      Prior to Admission medications   Medication Sig Start Date End Date Taking? Authorizing Provider  tiZANidine (ZANAFLEX) 4 MG tablet Take 1 tablet (4 mg total) by mouth daily. 05/17/21 06/16/21 Yes [facility], Farrel Gordon, PA  amoxicillin-clavulanate (AUGMENTIN) 500-125 MG tablet Take 1 tablet by mouth 2 (two) times daily. 12/02/19   [provider]  aspirin 81 MG chewable tablet Chew by mouth.    [provider]  atorvastatin (LIPITOR) 80 MG tablet Take by mouth. 09/04/18   [provider]   chlorpheniramine-HYDROcodone (TUSSIONEX PENNKINETIC ER) 10-8 MG/5ML SUER Take 5 mLs by mouth every 12 (twelve) hours as needed for cough. 12/09/19   Karen Kitchens, NP  pantoprazole (PROTONIX) 40 MG tablet Take by mouth. 12/27/18 12/27/19  [provider]  gabapentin (NEURONTIN) 300 MG capsule Take by mouth. 09/04/18 12/09/19  [provider]  metoprolol succinate (TOPROL-XL) 25 MG 24 hr tablet Take by mouth. 09/04/18 12/09/19  [provider]    Allergies Patient has no known allergies.  Family History  Problem Relation Age of Onset  . Dementia Mother   . Heart attack Father     Social History Social History   Tobacco Use  . Smoking status: Current Every Day Smoker    Packs/day: 0.50    Types: Cigarettes  . Smokeless tobacco: Never Used  Vaping Use  . Vaping Use: Never used  Substance Use Topics  . Alcohol use: No  . Drug use: No    Review of Systems Constitutional: No fever/chills Eyes: No visual changes. ENT: No sore throat. Cardiovascular: Denies chest pain. Respiratory: Denies shortness of breath. Gastrointestinal: No abdominal pain.  No nausea, no vomiting.  No diarrhea.  No constipation. Genitourinary: Negative for dysuria. Musculoskeletal: + Left arm pain,+ back pain. Skin: Negative for rash. Neurological: Negative for headaches, focal weakness or numbness.   ____________________________________________   PHYSICAL EXAM:  VITAL SIGNS: ED Triage Vitals  Enc Vitals Group     BP 05/17/21 1624 (!) 174/92     Pulse Rate 05/17/21 1624 (!) 53     Resp 05/17/21 1624 20     Temp 05/17/21 1624 98.1 F (36.7 C)     Temp Source 05/17/21 1624 Oral     SpO2 05/17/21 1624 96 %     Weight 05/17/21 1519 134 lb 0.6 oz (60.8 kg)     Height 05/17/21 1519 5\' 2"  (1.575 m)     Head Circumference --      Peak Flow --      Pain Score 05/17/21 1519 6     Pain Loc --      Pain Edu? --      Excl. in [place]? --    Constitutional: Alert and oriented. Well  appearing and in no acute distress. Eyes: Conjunctivae are normal. PERRL. EOMI. Head: Atraumatic. Nose: No congestion/rhinnorhea. Mouth/Throat: Mucous membranes are moist.  Oropharynx non-erythematous. Neck: No stridor.  There is tenderness to palpation of the bilateral paraspinals, no midline tenderness. Cardiovascular: Normal rate, regular rhythm. Grossly normal heart sounds.  Good peripheral circulation. Respiratory: Normal respiratory effort.  No retractions. Lungs CTAB. Gastrointestinal: Soft and nontender. No distention. No abdominal bruits. No CVA tenderness. Musculoskeletal: There is tenderness to palpation of the left mid humerus, left forearm and wrist distally.  There is no anatomic snuffbox tenderness.  Patient has full range of motion of left shoulder and elbow.  There is ecchymosis from the mid substance through the distal forearm.  Radial pulse 2+ bilaterally, capillary refill less than 3 seconds all digits.  There is tenderness to palpation of the midline and paraspinals of the lumbar spine, no ecchymosis present.  Full range of motion of the bilateral lower extremities with no difficulty, 5/5 strength in the bilateral lower extremities in ankle plantarflexion, dorsiflexion, knee flexion extension.  Dorsal pedal pulse 2+ bilaterally. Neurologic:  Normal speech and language. No gross focal neurologic deficits are appreciated. No gait instability. Skin:  Skin is warm, dry and intact. No rash noted. Psychiatric: Mood and affect are normal. Speech and behavior are normal.  ____________________________________________  RADIOLOGY I, Marlana Salvage, personally viewed and evaluated these images (plain radiographs) as part of my medical decision making, as well as reviewing the written report by the radiologist.  ED provider interpretation: Plain x-rays of the left forearm, humerus and lumbar spine were reviewed.  No acute fracture identified.  See radiology report for CT  findings.  Official radiology report(s): DG Lumbar Spine 2-3 Views  Result Date: 05/17/2021 CLINICAL DATA:  Low back pain since a fall 2 days ago. Initial encounter. EXAM: LUMBAR SPINE - 2-3 VIEW COMPARISON:  The plain films lumbar spine 05/05/2014. FINDINGS: There is no evidence of lumbar spine fracture. Alignment is normal. Intervertebral disc spaces are maintained. IMPRESSION: Negative lumbar spine. Electronically Signed   By: Inge Rise M.D.   On: 05/17/2021 18:46   DG Forearm Left  Result Date: 05/17/2021 CLINICAL DATA:  70 year old female with fall and trauma to the left upper extremity. EXAM: LEFT HUMERUS - 2+ VIEW; LEFT FOREARM - 2 VIEW; LEFT WRIST - COMPLETE 3+ VIEW COMPARISON:  None. FINDINGS: There is no acute fracture or dislocation. The bones are osteopenic. Degenerative changes of the left elbow with spurring and probable minimal effusion. There is also degenerative changes of the base of the thumb. The soft tissues are unremarkable. IMPRESSION: No acute fracture or dislocation. Electronically Signed   By: Anner Crete  M.D.   On: 05/17/2021 18:52   DG Wrist Complete Left  Result Date: 05/17/2021 CLINICAL DATA:  70 year old female with fall and trauma to the left upper extremity. EXAM: LEFT HUMERUS - 2+ VIEW; LEFT FOREARM - 2 VIEW; LEFT WRIST - COMPLETE 3+ VIEW COMPARISON:  None. FINDINGS: There is no acute fracture or dislocation. The bones are osteopenic. Degenerative changes of the left elbow with spurring and probable minimal effusion. There is also degenerative changes of the base of the thumb. The soft tissues are unremarkable. IMPRESSION: No acute fracture or dislocation. Electronically Signed   By: Anner Crete M.D.   On: 05/17/2021 18:52   CT Head Wo Contrast  Result Date: 05/17/2021 CLINICAL DATA:  Neck injury after fall yesterday. EXAM: CT HEAD WITHOUT CONTRAST CT CERVICAL SPINE WITHOUT CONTRAST TECHNIQUE: Multidetector CT imaging of the head and cervical spine  was performed following the standard protocol without intravenous contrast. Multiplanar CT image reconstructions of the cervical spine were also generated. COMPARISON:  None. FINDINGS: CT HEAD FINDINGS Brain: No evidence of acute infarction, hemorrhage, hydrocephalus, extra-axial collection or mass lesion/mass effect. Vascular: No hyperdense vessel or unexpected calcification. Skull: Normal. Negative for fracture or focal lesion. Sinuses/Orbits: No acute finding. Other: None. CT CERVICAL SPINE FINDINGS Alignment: Mild grade 1 anterolisthesis of C4-5 is noted secondary to posterior facet joint hypertrophy. Skull base and vertebrae: No acute fracture. No primary bone lesion or focal pathologic process. Soft tissues and spinal canal: No prevertebral fluid or swelling. No visible canal hematoma. Disc levels: Moderate degenerative disc disease is noted at C5-6 and C6-7 with anterior posterior osteophyte formation. Upper chest: Negative. Other: None. IMPRESSION: No acute intracranial abnormality seen. Moderate multilevel degenerative disc disease. No acute abnormality seen in the cervical spine. Electronically Signed   By: Marijo Conception M.D.   On: 05/17/2021 18:54   CT Cervical Spine Wo Contrast  Result Date: 05/17/2021 CLINICAL DATA:  Neck injury after fall yesterday. EXAM: CT HEAD WITHOUT CONTRAST CT CERVICAL SPINE WITHOUT CONTRAST TECHNIQUE: Multidetector CT imaging of the head and cervical spine was performed following the standard protocol without intravenous contrast. Multiplanar CT image reconstructions of the cervical spine were also generated. COMPARISON:  None. FINDINGS: CT HEAD FINDINGS Brain: No evidence of acute infarction, hemorrhage, hydrocephalus, extra-axial collection or mass lesion/mass effect. Vascular: No hyperdense vessel or unexpected calcification. Skull: Normal. Negative for fracture or focal lesion. Sinuses/Orbits: No acute finding. Other: None. CT CERVICAL SPINE FINDINGS Alignment: Mild  grade 1 anterolisthesis of C4-5 is noted secondary to posterior facet joint hypertrophy. Skull base and vertebrae: No acute fracture. No primary bone lesion or focal pathologic process. Soft tissues and spinal canal: No prevertebral fluid or swelling. No visible canal hematoma. Disc levels: Moderate degenerative disc disease is noted at C5-6 and C6-7 with anterior posterior osteophyte formation. Upper chest: Negative. Other: None. IMPRESSION: No acute intracranial abnormality seen. Moderate multilevel degenerative disc disease. No acute abnormality seen in the cervical spine. Electronically Signed   By: Marijo Conception M.D.   On: 05/17/2021 18:54   DG Humerus Left  Result Date: 05/17/2021 CLINICAL DATA:  70 year old female with fall and trauma to the left upper extremity. EXAM: LEFT HUMERUS - 2+ VIEW; LEFT FOREARM - 2 VIEW; LEFT WRIST - COMPLETE 3+ VIEW COMPARISON:  None. FINDINGS: There is no acute fracture or dislocation. The bones are osteopenic. Degenerative changes of the left elbow with spurring and probable minimal effusion. There is also degenerative changes of the base of the thumb. The  soft tissues are unremarkable. IMPRESSION: No acute fracture or dislocation. Electronically Signed   By: Anner Crete M.D.   On: 05/17/2021 18:52    ____________________________________________   INITIAL IMPRESSION / ASSESSMENT AND PLAN / ED COURSE  As part of my medical decision making, I reviewed the following data within the [place] notes reviewed and incorporated, Radiograph reviewed and Notes from prior ED visits        Patient is a 70 year old female who reports to the emergency department for evaluation of reportedly falling down 5 steps at her apartment complex 2 days ago with low back pain, bruising of her left forearm.  She is unsure if she hit her head.  See HPI for further details.  In triage, patient is hypertensive but otherwise has grossly normal vital signs.   On physical exam, she does have ecchymosis of the left forearm, no ecchymosis of the lumbar spine but she does have tenderness midline and paraspinals.  She has been ambulatory, denies red flag symptoms of loss of bowel or bladder control.  Imaging was obtained with a CT of the head and neck, x-ray of the lumbar spine, humerus and left forearm.  X-rays are negative for any acute fracture, CT negative for acute pathology.  At this time, likely musculoskeletal pain as the source of pain from her fall.  Discussed options for treating her pain including Tylenol, lidocaine patches, low-dose muscle relaxant.  Also offered one-time dose of Toradol here in the emergency department.  Patient was agreeable with plan with plans for PCP follow-up, however she left prior to being medicated here.  Return precautions were discussed prior to her leaving, she stable this time for outpatient management.      ____________________________________________   FINAL CLINICAL IMPRESSION(S) / ED DIAGNOSES  Final diagnoses:  Fall, initial encounter  Acute bilateral low back pain without sciatica  Left arm pain     ED Discharge Orders         Ordered    tiZANidine (ZANAFLEX) 4 MG tablet  Daily        05/17/21 1917          *Please note:  Crystina Borrayo was evaluated in Emergency Department on 05/17/2021 for the symptoms described in the history of present illness. She was evaluated in the context of the global COVID-19 pandemic, which necessitated consideration that the patient might be at risk for infection with the SARS-CoV-2 virus that causes COVID-19. Institutional protocols and algorithms that pertain to the evaluation of patients at risk for COVID-19 are in a state of rapid change based on information released by regulatory bodies including the CDC and federal and state organizations. These policies and algorithms were followed during the patient's care in the ED.  Some ED evaluations and interventions may be  delayed as a result of limited staffing during and the pandemic.*   Note:  This document was prepared using Dragon voice recognition software and may include unintentional dictation errors.   Marlana Salvage, PA 05/17/21 Aviva Kluver, MD 05/20/21 [phone]

## 2021-05-17 NOTE — ED Notes (Signed)
Pt not in room   No meds given.  No discharge inst to pt.

## 2021-09-19 ENCOUNTER — Ambulatory Visit (INDEPENDENT_AMBULATORY_CARE_PROVIDER_SITE_OTHER): Payer: Medicare Other | Admitting: Family Medicine

## 2021-09-19 ENCOUNTER — Other Ambulatory Visit: Payer: Self-pay

## 2021-09-19 ENCOUNTER — Encounter: Payer: Self-pay | Admitting: Family Medicine

## 2021-09-19 VITALS — BP 106/53 | HR 50 | Ht 62.0 in | Wt 124.8 lb

## 2021-09-19 DIAGNOSIS — E782 Mixed hyperlipidemia: Secondary | ICD-10-CM | POA: Diagnosis not present

## 2021-09-19 DIAGNOSIS — Z720 Tobacco use: Secondary | ICD-10-CM

## 2021-09-19 DIAGNOSIS — K219 Gastro-esophageal reflux disease without esophagitis: Secondary | ICD-10-CM

## 2021-09-19 DIAGNOSIS — Z1211 Encounter for screening for malignant neoplasm of colon: Secondary | ICD-10-CM

## 2021-09-19 DIAGNOSIS — M8949 Other hypertrophic osteoarthropathy, multiple sites: Secondary | ICD-10-CM | POA: Diagnosis not present

## 2021-09-19 DIAGNOSIS — F5104 Psychophysiologic insomnia: Secondary | ICD-10-CM

## 2021-09-19 DIAGNOSIS — M159 Polyosteoarthritis, unspecified: Secondary | ICD-10-CM

## 2021-09-19 MED ORDER — OMEPRAZOLE 40 MG PO CPDR: 40.0000 mg | DELAYED_RELEASE_CAPSULE | Freq: Every day | ORAL | 3 refills | Status: AC

## 2021-09-19 MED ORDER — ROSUVASTATIN CALCIUM 20 MG PO TABS: 20.0000 mg | ORAL_TABLET | Freq: Every evening | ORAL | 3 refills | Status: DC

## 2021-09-19 NOTE — Patient Instructions (Addendum)
Thank you for coming to the office today.  Consider the new medication discussed today - Mirtazapine (Remeron) would be low dose 7.5 or 15mg  nightly before bed - it is a sleeping medication that could help improve appetite and weight gain overall.  You could still take the Clonazepam with it but we would likely lower the dosage in future.  Stay tuned for GI referral  [facility]) [address], [place] [address] Phone: [phone]  Referral to [facility] for Lung CA Screening.   Please schedule a Follow-up Appointment to: Return in about 2 weeks (around 10/03/2021) for 2 week follow-up Insomnia / Anxiety.  If you have any other questions or concerns, please feel free to call the office or send a message through [place]. You may also schedule an earlier appointment if necessary.  Additionally, you may be receiving a survey about your experience at our office within a few days to 1 week by e-mail or mail. We value your feedback.  Nobie Putnam, DO [place]

## 2021-09-19 NOTE — Progress Notes (Signed)
Subjective:    Patient ID: Kelly Sanchez, female    DOB: Aug 04, 1951, 70 y.o.   MRN: [id number]  Kelly Sanchez is a 70 y.o. female presenting on 09/19/2021 for Establish Care  Caregiver   HPI  Reduced Appetite / Abnormal Sense of Smell History of COVID 1.5 year ago Insomnia and Anxiety  Difficulty gaining weight now poor appetite She does smoke cigarettes Smoking marijuana for arthritis pain  History of lost sister with cancer. Impacting her anxiety.  Previous Ambien, Lunesta meds unsuccsesful  HYPERLIPIDEMIA: - Reports no concerns. Last lipid panel >1 year ago - Currently taking Rosuvastatin, tolerating well without side effects or myalgias Off Atorvastatin  Neuropathy, feet Previously with with bilateral foot burning, left middle toe with numbness episodic. Tried Gabapentin  Osteoarthritis Multiple joints Reports neck C5-6 disc DJD Low back pain Lumbar DJD Bilateral hands Tried topicals Diclofenac voltaren, NSAIDs, gabapentin limited results.  Vitamin D Deficiency She was on Vitamin D Supplement for 1 month  Tobacco Abuse Active smoker   Health Maintenance:  Sister died with lung cancer.  Due for colonoscopy EGD   No flowsheet data found.  Past Medical History:  Diagnosis Date   Arthritis    Cancer [facility])    Past Surgical History:  Procedure Laterality Date   APPENDECTOMY     CHOLECYSTECTOMY     Social History   Socioeconomic History   Marital status: Widowed    Spouse name: Not on file   Number of children: Not on file   Years of education: Not on file   Highest education level: Not on file  Occupational History   Not on file  Tobacco Use   Smoking status: Every Day    Packs/day: 0.50    Types: Cigarettes   Smokeless tobacco: Never  Vaping Use   Vaping Use: Never used  Substance and Sexual Activity   Alcohol use: No   Drug use: Yes    Types: Marijuana   Sexual activity: Never    Birth control/protection: Post-menopausal  Other Topics  Concern   Not on file  Social History Narrative   Not on file   Social Determinants of Health   Financial Resource Strain: Not on file  Food Insecurity: Not on file  Transportation Needs: Not on file  Physical Activity: Not on file  Stress: Not on file  Social Connections: Not on file  Intimate Partner Violence: Not on file   Family History  Problem Relation Age of Onset   Dementia Mother    Heart attack Father    Current Outpatient Medications on File Prior to Visit  Medication Sig   aspirin 81 MG chewable tablet Chew by mouth.   clonazePAM (KLONOPIN) 0.5 MG tablet Take 0.5 mg by mouth 2 (two) times daily as needed.   [DISCONTINUED] gabapentin (NEURONTIN) 300 MG capsule Take by mouth.   [DISCONTINUED] metoprolol succinate (TOPROL-XL) 25 MG 24 hr tablet Take by mouth.   No current facility-administered medications on file prior to visit.    Review of Systems Per HPI unless specifically indicated above      Objective:    BP (!) 106/53   Pulse (!) 50   Ht 5\' 2"  (1.575 m)   Wt 124 lb 12.8 oz (56.6 kg)   SpO2 99%   BMI 22.83 kg/m   Wt Readings from Last 3 Encounters:  09/19/21 124 lb 12.8 oz (56.6 kg)  05/17/21 134 lb 0.6 oz (60.8 kg)  12/09/19 134 lb (60.8 kg)  Physical Exam Vitals and nursing note reviewed.  Constitutional:      General: She is not in acute distress.    Appearance: Normal appearance. She is well-developed. She is not diaphoretic.     Comments: Well-appearing, comfortable, cooperative  HENT:     Head: Normocephalic and atraumatic.  Eyes:     General:        Right eye: No discharge.        Left eye: No discharge.     Conjunctiva/sclera: Conjunctivae normal.  Cardiovascular:     Rate and Rhythm: Normal rate.  Pulmonary:     Effort: Pulmonary effort is normal.  Skin:    General: Skin is warm and dry.     Findings: No erythema or rash.  Neurological:     Mental Status: She is alert and oriented to person, place, and time.   Psychiatric:        Mood and Affect: Mood normal.        Behavior: Behavior normal.        Thought Content: Thought content normal.     Comments: Well groomed, good eye contact, normal speech and thoughts     CT Lung Cancer Screening Low Dose  Anatomical Region Laterality Modality  Lung -- Computed Tomography  Chest -- --   Impression   Lung-RADS Category: 1, continue screening with LDCT in 12 months. Narrative  EXAM: CT Chest without contrast; Lung Cancer Screening CT  DATE: 05/04/2020 5:08 PM  ACCESSION: [id number]  DICTATED: 05/05/2020 6:08 AM  INTERPRETATION LOCATION: [facility]   CLINICAL INDICATION: 70 years old Female with screen lung cancer ; Lung cancer screening, >= 30 pk - yr current smoker (Age [age]y)  - Z53.891 - Personal history of nicotine dependence     COMPARISON: None   TECHNIQUE: Serial axial images of the chest from the thoracic inlet through the hemidiaphragms were obtained without IV contrast. Coronal and sagittal reformatted images of the chest also provided. Low dose technique was utilized.   CTDI: 1.7  DLP: 57  kVp Topogram/kVp scan 80/100  mAs Topogram/ref mAs scan 35/41  QA Comment: Satisfactory.   NARRATIVE FINDINGS: Clear central airways. No pleural effusion. No consolidation.   Heart size normal. No pericardial effusion. Thoracic aorta normal caliber. No mediastinal lymphadenopathy.   Imaged abdomen and soft tissues unremarkable. Old sternal fracture.   LUNG NODULES: None. Exam End: 05/04/20 17:08   Specimen Collected: 05/05/20 06:08 Last Resulted: 05/05/20 06:20  Received From: [facility]  Result Received: 09/19/21 09:39     Results for orders placed or performed in visit on 11/25/19  Novel Coronavirus, NAA ([facility])   Specimen: Nasopharyngeal(NP) swabs in vial transport medium   NASOPHARYNGE  TESTING  Result Value Ref Range   SARS-CoV-2, NAA Not Detected Not Detected      Assessment & Plan:   Problem List Items  Addressed This Visit   None Visit Diagnoses     Primary osteoarthritis involving multiple joints    -  Primary   Chronic insomnia       Mixed hyperlipidemia       Relevant Medications   rosuvastatin (CRESTOR) 20 MG tablet   Gastroesophageal reflux disease without esophagitis       Relevant Medications   omeprazole (PRILOSEC) 40 MG capsule   Other Relevant Orders   Ambulatory referral to Gastroenterology   Screening for colon cancer       Relevant Orders   Ambulatory referral to Gastroenterology  Tobacco abuse       Relevant Orders   Ambulatory Referral Lung Cancer Screening [facility]       Request outside medical records / review chart in [facility] from previous PCp and specialists.  Referral to [facility] for EGD / Colonoscopy screening and for GERD, previous PCP recommended but patient relocated before could be referred.  Refill PPI Omeprazole 40mg  daily  Smoking Cessation - declined today Tobacco abuse Will order LDCT referral through [facility]. She had it done last year 04/2020 negative through [facility]  HLD Refill Rosuvastatin  Anxiety Insomnia Will discuss at next visit in 2 weeks - today unable to refill controlled med Clonazepam/Klonopin will defer til next visit, long history of taking, reviewed PDMP, however will reconsider other options. Has failed several therapy for insomnia.  Orders Placed This Encounter  Procedures   Ambulatory referral to Gastroenterology    Referral Priority:   Routine    Referral Type:   Consultation    Referral Reason:   Specialty Services Required    Number of Visits Requested:   1   Ambulatory Referral Lung Cancer Screening [facility]    Referral Priority:   Routine    Referral Type:   Consultation    Referral Reason:   Specialty Services Required    Number of Visits Requested:   1      Meds ordered this encounter  Medications   rosuvastatin (CRESTOR) 20 MG tablet    Sig: Take 1 tablet (20 mg total) by mouth  every evening.    Dispense:  90 tablet    Refill:  3   omeprazole (PRILOSEC) 40 MG capsule    Sig: Take 1 capsule (40 mg total) by mouth daily before breakfast.    Dispense:  90 capsule    Refill:  3      Follow up plan: Return in about 2 weeks (around 10/03/2021) for 2 week follow-up Insomnia / Anxiety.  Nobie Putnam, [facility] 09/19/2021, 10:12 AM

## 2021-09-20 ENCOUNTER — Encounter: Payer: Self-pay | Admitting: *Deleted

## 2021-10-03 ENCOUNTER — Ambulatory Visit (INDEPENDENT_AMBULATORY_CARE_PROVIDER_SITE_OTHER): Payer: Medicare Other | Admitting: Family Medicine

## 2021-10-03 ENCOUNTER — Other Ambulatory Visit: Payer: Self-pay

## 2021-10-03 ENCOUNTER — Encounter: Payer: Self-pay | Admitting: Family Medicine

## 2021-10-03 VITALS — BP 145/80 | HR 66 | Ht 62.0 in | Wt 125.6 lb

## 2021-10-03 DIAGNOSIS — F5104 Psychophysiologic insomnia: Secondary | ICD-10-CM

## 2021-10-03 DIAGNOSIS — J0100 Acute maxillary sinusitis, unspecified: Secondary | ICD-10-CM

## 2021-10-03 DIAGNOSIS — F411 Generalized anxiety disorder: Secondary | ICD-10-CM

## 2021-10-03 MED ORDER — CLONAZEPAM 0.5 MG PO TABS: 0.5000 mg | ORAL_TABLET | Freq: Two times a day (BID) | ORAL | 2 refills | Status: DC | PRN

## 2021-10-03 MED ORDER — FLUTICASONE PROPIONATE 50 MCG/ACT NA SUSP: 2.0000 | Freq: Every day | NASAL | 3 refills | Status: DC

## 2021-10-03 NOTE — Progress Notes (Signed)
Subjective:    Patient ID: Kelly Sanchez, female    DOB: June 03, 1951, 70 y.o.   MRN: [id number]  Kelly Sanchez is a 70 y.o. female presenting on 10/03/2021 for Insomnia and Anxiety   HPI  Acute Sinusitis Reports symptoms onset last week Friday now worse on Monday. She has post nasal drainage and coughing it up. Without significant sinus pain but has pressure Denies any fevers  Anxiety Insomnia Previous PCP was managed by PCP on Clonazepam  Tried Lunesta, Ambien had grogginess and side effects. She has had issues with SSRI in past Lexapro and other medications tried. Maintained on current medication for years. Taking CLonazepam 0.5mg  BID regular dosing. Helps with insomnia   Depression screen PHQ 2/9 10/03/2021  Decreased Interest 1  Down, Depressed, Hopeless 2  PHQ - 2 Score 3  Altered sleeping 1  Tired, decreased energy 3  Change in appetite 3  Feeling bad or failure about yourself  0  Trouble concentrating 0  Moving slowly or fidgety/restless 2  Suicidal thoughts 0  PHQ-9 Score 12  Difficult doing work/chores Somewhat difficult   GAD 7 : Generalized Anxiety Score 10/03/2021  Nervous, Anxious, on Edge 2  Control/stop worrying 2  Worry too much - different things 2  Trouble relaxing 2  Restless 1  Easily annoyed or irritable 2  Afraid - awful might happen 1  Total GAD 7 Score 12  Anxiety Difficulty Somewhat difficult      Social History   Tobacco Use   Smoking status: Every Day    Packs/day: 0.50    Types: Cigarettes   Smokeless tobacco: Never  Vaping Use   Vaping Use: Never used  Substance Use Topics   Alcohol use: No   Drug use: Yes    Types: Marijuana    Review of Systems Per HPI unless specifically indicated above     Objective:    BP (!) 145/80   Pulse 66   Ht 5\' 2"  (1.575 m)   Wt 125 lb 9.6 oz (57 kg)   SpO2 100%   BMI 22.97 kg/m   Wt Readings from Last 3 Encounters:  10/03/21 125 lb 9.6 oz (57 kg)  09/19/21 124 lb 12.8 oz (56.6 kg)   05/17/21 134 lb 0.6 oz (60.8 kg)    Physical Exam Vitals and nursing note reviewed.  Constitutional:      General: She is not in acute distress.    Appearance: Normal appearance. She is well-developed. She is not diaphoretic.     Comments: Well-appearing, comfortable, cooperative  HENT:     Head: Normocephalic and atraumatic.  Eyes:     General:        Right eye: No discharge.        Left eye: No discharge.     Conjunctiva/sclera: Conjunctivae normal.  Cardiovascular:     Rate and Rhythm: Normal rate.  Pulmonary:     Effort: Pulmonary effort is normal.  Skin:    General: Skin is warm and dry.     Findings: No erythema or rash.  Neurological:     Mental Status: She is alert and oriented to person, place, and time.  Psychiatric:        Mood and Affect: Mood normal.        Behavior: Behavior normal.        Thought Content: Thought content normal.     Comments: Well groomed, good eye contact, normal speech and thoughts   Results for orders placed or performed  in visit on 11/25/19  Novel Coronavirus, NAA ([facility])   Specimen: Nasopharyngeal(NP) swabs in vial transport medium   NASOPHARYNGE  TESTING  Result Value Ref Range   SARS-CoV-2, NAA Not Detected Not Detected      Assessment & Plan:   Problem List Items Addressed This Visit   None Visit Diagnoses     GAD (generalized anxiety disorder)    -  Primary   Relevant Medications   clonazePAM (KLONOPIN) 0.5 MG tablet   Acute non-recurrent maxillary sinusitis       Relevant Medications   fluticasone (FLONASE) 50 MCG/ACT nasal spray   Chronic insomnia       Relevant Medications   clonazePAM (KLONOPIN) 0.5 MG tablet       Generalized Anxiety  Chronic insomnia underlying complication Chronic management on BDZ therapy from prior PCP. Reviewed past med history prior meds tried / failed before Will agree to renew BDZ Clonazepam at current dose 0.5mg  BID regularly dosing, 1 month with 2 refills ordered today after review  PDMP. Discussion on risks with benzo therapy for anxiety and potential withdrawal and dependence. We will reconsider other meds in future or consider referral to mental health if indicated. Sign non opioid controlled contract at future visit if continue med.  Acute Sinusitis Likely allergy and seasonal related Post nasal drainage No sign of acute bacterial infection Start nasal steroid Flonase 2 sprays in each nostril daily for 4-6 weeks, may repeat course seasonally or as needed   Meds ordered this encounter  Medications   fluticasone (FLONASE) 50 MCG/ACT nasal spray    Sig: Place 2 sprays into both nostrils daily. Use for 4-6 weeks then stop and use seasonally or as needed.    Dispense:  16 g    Refill:  3   clonazePAM (KLONOPIN) 0.5 MG tablet    Sig: Take 1 tablet (0.5 mg total) by mouth 2 (two) times daily as needed for anxiety.    Dispense:  60 tablet    Refill:  2     Follow up plan: Return in about 3 months (around 01/03/2022) for 3 month follow-up Anxiety med refill / Insomnia.    Nobie Putnam, DO [facility] 10/03/2021, 11:19 AM

## 2021-10-03 NOTE — Patient Instructions (Addendum)
Thank you for coming to the office today.  Refilled Clonazepam twice a day. For now keep in mind future consideration of other med options if need.  Start nasal steroid Flonase 2 sprays in each nostril daily for 4-6 weeks, may repeat course seasonally or as needed    Please schedule a Follow-up Appointment to: Return in about 3 months (around 01/03/2022) for 3 month follow-up Anxiety med refill / Insomnia.  If you have any other questions or concerns, please feel free to call the office or send a message through [place]. You may also schedule an earlier appointment if necessary.  Additionally, you may be receiving a survey about your experience at our office within a few days to 1 week by e-mail or mail. We value your feedback.  Nobie Putnam, DO [place]

## 2021-10-31 ENCOUNTER — Ambulatory Visit: Payer: Self-pay

## 2021-10-31 NOTE — Telephone Encounter (Signed)
Pt called in stating that she is having a lot of sinus pain and pressure that has been going on for a month to month and a half now and needs an antibiotic. She reports having sinus pain in between her eyes and cheeks, pain level of 6-7/10, and has fever and chills at night time. Says that shes tried everything she knew to do including taking tylenol and motrin, using vicks and netipot. She has prescription nasal spray but when she uses it, it causes heart fluttering and palpitations so she quit using it. Appt made for pt 11/01/21 @ 1600 with Dr. Parks Ranger. Care advice given and pt verbalized understanding.     Reason for Disposition  [1] Sinus pain (not just congestion) AND [2] fever  Answer Assessment - Initial Assessment Questions 1. LOCATION: "Where does it hurt?"      In between the eyes and nose area 2. ONSET: "When did the sinus pain start?"  (e.g., hours, days)      Month moth and half 3. SEVERITY: "How bad is the pain?"   (Scale 1-10; mild, moderate or severe)   - MILD (1-3): doesn't interfere with normal activities    - MODERATE (4-7): interferes with normal activities (e.g., work or school) or awakens from sleep   - SEVERE (8-10): excruciating pain and patient unable to do any normal activities        6-7 4. RECURRENT SYMPTOM: "Have you ever had sinus problems before?" If Yes, ask: "When was the last time?" and "What happened that time?"      Yes, 9 months ago 5. NASAL CONGESTION: "Is the nose blocked?" If Yes, ask: "Can you open it or must you breathe through your mouth?"     Some dasys 6. NASAL DISCHARGE: "Do you have discharge from your nose?" If so ask, "What color?"     Yellow color 7. FEVER: "Do you have a fever?" If Yes, ask: "What is it, how was it measured, and when did it start?"      Chills and night time hot and cold 8. OTHER SYMPTOMS: "Do you have any other symptoms?" (e.g., sore throat, cough, earache, difficulty breathing)     Sore throat scratchy, earache 9.  PREGNANCY: "Is there any chance you are pregnant?" "When was your last menstrual period?"     no  Protocols used: Sinus Pain or Congestion-A-AH

## 2021-11-01 ENCOUNTER — Telehealth: Payer: Medicare Other | Admitting: Family Medicine

## 2021-11-14 ENCOUNTER — Other Ambulatory Visit: Payer: Self-pay | Admitting: Family Medicine

## 2021-11-14 DIAGNOSIS — J0100 Acute maxillary sinusitis, unspecified: Secondary | ICD-10-CM

## 2021-11-14 MED ORDER — AMOXICILLIN-POT CLAVULANATE 875-125 MG PO TABS: 1.0000 | ORAL_TABLET | Freq: Two times a day (BID) | ORAL | 0 refills | Status: DC

## 2021-12-13 ENCOUNTER — Ambulatory Visit: Payer: Self-pay | Admitting: *Deleted

## 2021-12-13 DIAGNOSIS — J0100 Acute maxillary sinusitis, unspecified: Secondary | ICD-10-CM

## 2021-12-13 NOTE — Telephone Encounter (Signed)
I attempted to return her call.  Her voicemail is not set up so I was not able to leave a message.     She had called in earlier today because the Augmentin 875-125 mg was never received by her pharmacy from 11/14/2021.   She is feeling awful and would like to get this today.

## 2021-12-13 NOTE — Telephone Encounter (Signed)
Patient called in along with her female friend to say that Rx sent on 11/14/21 for amoxicillin-clavulanate (AUGMENTIN) 875-125 MG tablet was never received per pharmacy and now she is feeling awful and want this medication today if possible. Please advise and call patient at Ph#   [phone]    Chief Complaint: sinus headache, head congestion, dry cough requesting medication that was prescribed 11/14/21 Augmentin and patient never picked up from pharmacy.  Symptoms: headache, dry cough, sinus pain, head congestion, blowing nose yellow drainage.  Frequency: on and off x 1 month  Pertinent Negatives: Patient denies difficulty breathing, fever, sore throat . No SOB. Has not taken covid test  Disposition: [] ED /[] Urgent Care (no appt availability in office) / [] Appointment(In office/virtual)/ []  [facility]/ [] Home Care/ [x] Refused Recommended Disposition  Additional Notes:  Attempted to schedule appt for 12/21/21 patient declined and would like medication prescribed. Please advise .           Reason for Disposition  [1] Sinus congestion (pressure, fullness) AND [2] present > 10 days  Answer Assessment - Initial Assessment Questions 1. LOCATION: "Where does it hurt?"      sinuses 2. ONSET: "When did the sinus pain start?"  (e.g., hours, days)      Over a month ago on and off  3. SEVERITY: "How bad is the pain?"   (Scale 1-10; mild, moderate or severe)   - MILD (1-3): doesn't interfere with normal activities    - MODERATE (4-7): interferes with normal activities (e.g., work or school) or awakens from sleep   - SEVERE (8-10): excruciating pain and patient unable to do any normal activities        Getting worse  4. RECURRENT SYMPTOM: "Have you ever had sinus problems before?" If Yes, ask: "When was the last time?" and "What happened that time?"      Yes on and off x 1 month 5. NASAL CONGESTION: "Is the nose blocked?" If Yes, ask: "Can you open it or must you breathe through  your mouth?"     Blowing nose for yellow mucus  6. NASAL DISCHARGE: "Do you have discharge from your nose?" If so ask, "What color?"     Yes yellow  7. FEVER: "Do you have a fever?" If Yes, ask: "What is it, how was it measured, and when did it start?"      Denies  8. OTHER SYMPTOMS: "Do you have any other symptoms?" (e.g., sore throat, cough, earache, difficulty breathing)     Head congestion, headache, dry cough  9. PREGNANCY: "Is there any chance you are pregnant?" "When was your last menstrual period?"     na  Protocols used: Sinus Pain or Congestion-A-AH

## 2021-12-14 NOTE — Telephone Encounter (Signed)
She will pick it up today, report from pharmacy is that it was never picked up originally  Nobie Putnam, [place] 12/14/2021, 12:28 PM

## 2021-12-14 NOTE — Telephone Encounter (Signed)
It was sent in and received by pharmacy. I am not sure what happened.  Here is the description on the order  amoxicillin-clavulanate (AUGMENTIN) 875-125 MG tablet 20 tablet 0 11/14/2021    Sig - Route: Take 1 tablet by mouth 2 (two) times daily. - Oral   Sent to pharmacy as: amoxicillin-clavulanate (AUGMENTIN) 875-125 MG tablet   E-Prescribing Status: Receipt confirmed by pharmacy (11/14/2021  1:51 PM EST)

## 2021-12-30 ENCOUNTER — Other Ambulatory Visit: Payer: Self-pay

## 2021-12-30 ENCOUNTER — Encounter: Payer: Self-pay | Admitting: Family Medicine

## 2021-12-30 ENCOUNTER — Ambulatory Visit (INDEPENDENT_AMBULATORY_CARE_PROVIDER_SITE_OTHER): Payer: Medicare Other | Admitting: Family Medicine

## 2021-12-30 VITALS — BP 125/69 | HR 56 | Ht 62.0 in | Wt 125.2 lb

## 2021-12-30 DIAGNOSIS — R7309 Other abnormal glucose: Secondary | ICD-10-CM | POA: Diagnosis not present

## 2021-12-30 DIAGNOSIS — F5104 Psychophysiologic insomnia: Secondary | ICD-10-CM | POA: Diagnosis not present

## 2021-12-30 DIAGNOSIS — E782 Mixed hyperlipidemia: Secondary | ICD-10-CM | POA: Diagnosis not present

## 2021-12-30 DIAGNOSIS — M25562 Pain in left knee: Secondary | ICD-10-CM

## 2021-12-30 DIAGNOSIS — F411 Generalized anxiety disorder: Secondary | ICD-10-CM

## 2021-12-30 DIAGNOSIS — M159 Polyosteoarthritis, unspecified: Secondary | ICD-10-CM

## 2021-12-30 DIAGNOSIS — G8929 Other chronic pain: Secondary | ICD-10-CM

## 2021-12-30 MED ORDER — CLONAZEPAM 0.5 MG PO TABS: 0.5000 mg | ORAL_TABLET | Freq: Two times a day (BID) | ORAL | 2 refills | Status: DC | PRN

## 2021-12-30 NOTE — Patient Instructions (Addendum)
Thank you for coming to the office today.  [facility] Address: [address], [place], [place] [address] Hours:  9AM-5PM Phone: [phone]  Stay tuned for apt on arthritis.  ------------  Refilled Clonazepam  Please schedule a Follow-up Appointment to: Return in about 3 months (around 03/30/2022) for 3 month anxiety refill / Ortho updates.  If you have any other questions or concerns, please feel free to call the office or send a message through [place]. You may also schedule an earlier appointment if necessary.  Additionally, you may be receiving a survey about your experience at our office within a few days to 1 week by e-mail or mail. We value your feedback.  Nobie Putnam, DO [place]

## 2021-12-30 NOTE — Progress Notes (Signed)
Subjective:    Patient ID: Kelly Sanchez, female    DOB: 1951/05/23, 71 y.o.   MRN: [id number]  Kelly Sanchez is a 71 y.o. female presenting on 12/30/2021 for Anxiety   HPI  Anxiety Insomnia Previous PCP was managed by PCP on Clonazepam  Tried Lunesta, Ambien had grogginess and side effects. She has had issues with SSRI in past Lexapro and other medications tried. Maintained on current medication for years. Taking CLonazepam 0.5mg  BID regular dosing. Helps with insomnia Needs refill today has done well in past 3 months  Regarding GI referral Awaiting re-schedule. She had apt but did not schedule. She can cal them back.  Strong fam history of Dementia  Osteoarthritis Multiple joints Bilateral knees, hands and various Previously with s/p L Knee TKR, has hardware, has chronic pain in that Knee. Cannot tolerate NSAIDs. Tried Voltaren PRN with some good relief She requests referral to specialist.  Past Surgical History:  Procedure Laterality Date   APPENDECTOMY     CHOLECYSTECTOMY     TOTAL KNEE ARTHROPLASTY Left     Depression screen PHQ 2/9 10/03/2021  Decreased Interest 1  Down, Depressed, Hopeless 2  PHQ - 2 Score 3  Altered sleeping 1  Tired, decreased energy 3  Change in appetite 3  Feeling bad or failure about yourself  0  Trouble concentrating 0  Moving slowly or fidgety/restless 2  Suicidal thoughts 0  PHQ-9 Score 12  Difficult doing work/chores Somewhat difficult   GAD 7 : Generalized Anxiety Score 10/03/2021  Nervous, Anxious, on Edge 2  Control/stop worrying 2  Worry too much - different things 2  Trouble relaxing 2  Restless 1  Easily annoyed or irritable 2  Afraid - awful might happen 1  Total GAD 7 Score 12  Anxiety Difficulty Somewhat difficult      Social History   Tobacco Use   Smoking status: Every Day    Packs/day: 0.50    Types: Cigarettes   Smokeless tobacco: Never  Vaping Use   Vaping Use: Never used  Substance Use Topics   Alcohol  use: No   Drug use: Yes    Types: Marijuana    Review of Systems Per HPI unless specifically indicated above     Objective:    BP 125/69    Pulse (!) 56    Ht 5\' 2"  (1.575 m)    Wt 125 lb 3.2 oz (56.8 kg)    SpO2 99%    BMI 22.90 kg/m   Wt Readings from Last 3 Encounters:  12/30/21 125 lb 3.2 oz (56.8 kg)  10/03/21 125 lb 9.6 oz (57 kg)  09/19/21 124 lb 12.8 oz (56.6 kg)    Physical Exam Vitals and nursing note reviewed.  Constitutional:      General: She is not in acute distress.    Appearance: Normal appearance. She is well-developed. She is not diaphoretic.     Comments: Well-appearing, comfortable, cooperative  HENT:     Head: Normocephalic and atraumatic.  Eyes:     General:        Right eye: No discharge.        Left eye: No discharge.     Conjunctiva/sclera: Conjunctivae normal.  Cardiovascular:     Rate and Rhythm: Normal rate.  Pulmonary:     Effort: Pulmonary effort is normal.  Musculoskeletal:     Comments: L knee mild swelling effusion, tender to palpation, no erythema. Has range of motion some limitation  Skin:  General: Skin is warm and dry.     Findings: No erythema or rash.  Neurological:     Mental Status: She is alert and oriented to person, place, and time.  Psychiatric:        Mood and Affect: Mood normal.        Behavior: Behavior normal.        Thought Content: Thought content normal.     Comments: Well groomed, good eye contact, normal speech and thoughts   Results for orders placed or performed in visit on 11/25/19  Novel Coronavirus, NAA ([facility])   Specimen: Nasopharyngeal(NP) swabs in vial transport medium   NASOPHARYNGE  TESTING  Result Value Ref Range   SARS-CoV-2, NAA Not Detected Not Detected      Assessment & Plan:   Problem List Items Addressed This Visit   None Visit Diagnoses     Mixed hyperlipidemia    -  Primary   Relevant Orders   COMPLETE METABOLIC PANEL WITH GFR   Lipid panel   GAD (generalized anxiety  disorder)       Relevant Medications   clonazePAM (KLONOPIN) 0.5 MG tablet   Other Relevant Orders   COMPLETE METABOLIC PANEL WITH GFR   Chronic insomnia       Relevant Medications   clonazePAM (KLONOPIN) 0.5 MG tablet   Other Relevant Orders   CBC with Differential/Platelet   Abnormal glucose       Relevant Orders   Hemoglobin A1c   Primary osteoarthritis involving multiple joints       Relevant Orders   Ambulatory referral to Orthopedic Surgery   Chronic pain of left knee       Relevant Medications   clonazePAM (KLONOPIN) 0.5 MG tablet   Other Relevant Orders   Ambulatory referral to Orthopedic Surgery       GAD Insomnia  Generalized Anxiety  Chronic insomnia underlying complication Chronic management on BDZ therapy from prior PCP. Reviewed past med history prior meds tried / failed before Will agree to renew BDZ Clonazepam at current dose 0.5mg  BID regularly dosing, 1 month with 2 refills ordered today after review PDMP. Discussion on risks with benzo therapy for anxiety and potential withdrawal and dependence. We will reconsider other meds in future or consider referral to mental health if indicated. Sign non opioid controlled contract today. Scanned to chart.  Left Knee s/p TKR Chronic pain since hardware/surgery Referral to local orthopedic for further eval, she cannot return to prior ortho, as they have retired and she cannot travel to [employer] OTC options and topical voltaren and regimen for pain management, would not be able to order other pain medication at this time. She can follow w/ ortho and in future if other treatment needed can refer.  Labs today. Ordered for follow up.  Orders Placed This Encounter  Procedures   COMPLETE METABOLIC PANEL WITH GFR   Lipid panel    Order Specific Question:   Has the patient fasted?    Answer:   Yes   CBC with Differential/Platelet   Hemoglobin A1c   Ambulatory referral to Orthopedic Surgery    Referral Priority:    Routine    Referral Type:   Surgical    Referral Reason:   Specialty Services Required    Requested Specialty:   Orthopedic Surgery    Number of Visits Requested:   1     Meds ordered this encounter  Medications   clonazePAM (KLONOPIN) 0.5 MG tablet    Sig:  Take 1 tablet (0.5 mg total) by mouth 2 (two) times daily as needed for anxiety.    Dispense:  60 tablet    Refill:  2      Follow up plan: Return in about 3 months (around 03/30/2022) for 3 month anxiety refill / Ortho updates.  Nobie Putnam, [facility] 12/30/2021, 11:19 AM

## 2021-12-31 LAB — HEMOGLOBIN A1C
Hgb A1c MFr Bld: 5.4 % of total Hgb (ref <5.7)
Mean Plasma Glucose: 108 mg/dL
eAG (mmol/L): 6 mmol/L

## 2021-12-31 LAB — LIPID PANEL
Cholesterol: 147 mg/dL (ref <200)
HDL: 52 mg/dL (ref >=50)
LDL Cholesterol (Calc): 79 mg/dL (calc)
Non-HDL Cholesterol (Calc): 95 mg/dL (calc) (ref <130)
Total CHOL/HDL Ratio: 2.8 (calc) (ref <5.0)
Triglycerides: 78 mg/dL (ref <150)

## 2021-12-31 LAB — COMPLETE METABOLIC PANEL WITH GFR
AG Ratio: 1.6 (calc) (ref 1.0–2.5)
ALT: 12 U/L (ref 6–29)
AST: 18 U/L (ref 10–35)
Albumin: 4.1 g/dL (ref 3.6–5.1)
Alkaline phosphatase (APISO): 60 U/L (ref 37–153)
BUN: 9 mg/dL (ref 7–25)
CO2: 26 mmol/L (ref 20–32)
Calcium: 9.2 mg/dL (ref 8.6–10.4)
Chloride: 110 mmol/L (ref 98–110)
Creat: 0.97 mg/dL (ref 0.60–1.00)
Globulin: 2.6 g/dL (calc) (ref 1.9–3.7)
Glucose, Bld: 100 mg/dL (ref 65–139)
Potassium: 4 mmol/L (ref 3.5–5.3)
Sodium: 143 mmol/L (ref 135–146)
Total Bilirubin: 0.4 mg/dL (ref 0.2–1.2)
Total Protein: 6.7 g/dL (ref 6.1–8.1)
eGFR: 63 mL/min/{1.73_m2} (ref >=60)

## 2021-12-31 LAB — CBC WITH DIFFERENTIAL/PLATELET
Absolute Monocytes: 384 cells/uL (ref 200–950)
Basophils Absolute: 52 cells/uL (ref 0–200)
Basophils Relative: 0.8 %
Eosinophils Absolute: 130 cells/uL (ref 15–500)
Eosinophils Relative: 2 %
HCT: 39.8 % (ref 35.0–45.0)
Hemoglobin: 13.4 g/dL (ref 11.7–15.5)
Lymphs Abs: 2730 cells/uL (ref 850–3900)
MCH: 31.2 pg (ref 27.0–33.0)
MCHC: 33.7 g/dL (ref 32.0–36.0)
MCV: 92.6 fL (ref 80.0–100.0)
MPV: 11 fL (ref 7.5–12.5)
Monocytes Relative: 5.9 %
Neutro Abs: 3205 cells/uL (ref 1500–7800)
Neutrophils Relative %: 49.3 %
Platelets: 180 10*3/uL (ref 140–400)
RBC: 4.3 10*6/uL (ref 3.80–5.10)
RDW: 12.9 % (ref 11.0–15.0)
Total Lymphocyte: 42 %
WBC: 6.5 10*3/uL (ref 3.8–10.8)

## 2022-01-10 ENCOUNTER — Telehealth: Payer: Self-pay

## 2022-01-10 NOTE — Telephone Encounter (Signed)
Copied from [place] [phone] [id number]. Topic: Referral - Status >> Jan 10, 2022 12:37 PM Yvette Rack wrote: Reason for CRM: Pt caregiver reports that pt has not heard from the pain management clinic.    The referral is for orthopaedics at [facility], not the pain clinic.   She can call their office to check the status. I tried to call her but there was no answer.

## 2022-01-13 ENCOUNTER — Telehealth: Payer: Self-pay | Admitting: Family Medicine

## 2022-01-13 NOTE — Telephone Encounter (Signed)
N/A unable to leave a message for patient to call back and schedule the Medicare Annual Wellness Visit (AWV) virtually or by telephone.  Last AWV 07/25/17  Please schedule at anytime with [facility]  40 minute appointment  Any questions, please call me at [phone]

## 2022-01-26 ENCOUNTER — Ambulatory Visit: Payer: Self-pay | Admitting: *Deleted

## 2022-01-26 DIAGNOSIS — K922 Gastrointestinal hemorrhage, unspecified: Secondary | ICD-10-CM | POA: Diagnosis not present

## 2022-01-26 DIAGNOSIS — Z87891 Personal history of nicotine dependence: Secondary | ICD-10-CM | POA: Diagnosis not present

## 2022-01-26 DIAGNOSIS — Z9049 Acquired absence of other specified parts of digestive tract: Secondary | ICD-10-CM | POA: Diagnosis not present

## 2022-01-26 DIAGNOSIS — M545 Low back pain, unspecified: Secondary | ICD-10-CM | POA: Diagnosis not present

## 2022-01-26 DIAGNOSIS — K625 Hemorrhage of anus and rectum: Secondary | ICD-10-CM | POA: Diagnosis not present

## 2022-01-26 DIAGNOSIS — Z20822 Contact with and (suspected) exposure to covid-19: Secondary | ICD-10-CM | POA: Diagnosis not present

## 2022-01-26 DIAGNOSIS — Z79899 Other long term (current) drug therapy: Secondary | ICD-10-CM | POA: Diagnosis not present

## 2022-01-26 DIAGNOSIS — I2510 Atherosclerotic heart disease of native coronary artery without angina pectoris: Secondary | ICD-10-CM | POA: Diagnosis not present

## 2022-01-26 DIAGNOSIS — Z6822 Body mass index (BMI) 22.0-22.9, adult: Secondary | ICD-10-CM | POA: Diagnosis not present

## 2022-01-26 DIAGNOSIS — R1032 Left lower quadrant pain: Secondary | ICD-10-CM | POA: Diagnosis not present

## 2022-01-26 DIAGNOSIS — G4700 Insomnia, unspecified: Secondary | ICD-10-CM | POA: Diagnosis not present

## 2022-01-26 DIAGNOSIS — E785 Hyperlipidemia, unspecified: Secondary | ICD-10-CM | POA: Diagnosis not present

## 2022-01-26 DIAGNOSIS — Z7982 Long term (current) use of aspirin: Secondary | ICD-10-CM | POA: Diagnosis not present

## 2022-01-26 DIAGNOSIS — R001 Bradycardia, unspecified: Secondary | ICD-10-CM | POA: Diagnosis not present

## 2022-01-26 NOTE — Telephone Encounter (Signed)
° °  Chief Complaint: rectal bleeding . Blood noted in toilet Symptoms: small clots in toilet , diarrhea, mostly blood in toilet Frequency: 2-3 times today  Pertinent Negatives: Patient denies dizziness, lightheadedness, no abdominal cramping  Disposition: [x] ED /[] Urgent Care (no appt availability in office) / [] Appointment(In office/virtual)/ []  [place] [facility] [place] [facility]/ [] Home Care/ [] Refused Recommended Disposition /[] [place] [facility]/ []  Follow-up with PCP Additional Notes:        Reason for Disposition  [1] MODERATE rectal bleeding (small blood clots, passing blood without stool, or toilet water turns red) AND [2] more than once a day  Answer Assessment - Initial Assessment Questions 1. APPEARANCE of BLOOD: "What color is it?" "Is it passed separately, on the surface of the stool, or mixed in with the stool?"      Blood in toilet  2. AMOUNT: "How much blood was passed?"      "A lot filled toilet  3. FREQUENCY: "How many times has blood been passed with the stools?"      2-3 times today  4. ONSET: "When was the blood first seen in the stools?" (Days or weeks)      Today  5. DIARRHEA: "Is there also some diarrhea?" If Yes, ask: "How many diarrhea stools in the past 24 hours?"      Diarrhea  6. CONSTIPATION: "Do you have constipation?" If Yes, ask: "How bad is it?"     na 7. RECURRENT SYMPTOMS: "Have you had blood in your stools before?" If Yes, ask: "When was the last time?" and "What happened that time?"      Not like this  8. BLOOD THINNERS: "Do you take any blood thinners?" (e.g., Coumadin/warfarin, Pradaxa/dabigatran, aspirin)     ASA 9. OTHER SYMPTOMS: "Do you have any other symptoms?"  (e.g., abdomen pain, vomiting, dizziness, fever)     No  10. PREGNANCY: "Is there any chance you are pregnant?" "When was your last menstrual period?"       na  Protocols used: Rectal Bleeding-A-AH

## 2022-01-27 DIAGNOSIS — E785 Hyperlipidemia, unspecified: Secondary | ICD-10-CM | POA: Diagnosis not present

## 2022-01-27 DIAGNOSIS — K922 Gastrointestinal hemorrhage, unspecified: Secondary | ICD-10-CM | POA: Diagnosis not present

## 2022-01-30 ENCOUNTER — Ambulatory Visit (INDEPENDENT_AMBULATORY_CARE_PROVIDER_SITE_OTHER): Payer: Medicare Other | Admitting: Family Medicine

## 2022-01-30 ENCOUNTER — Encounter: Payer: Self-pay | Admitting: Family Medicine

## 2022-01-30 ENCOUNTER — Other Ambulatory Visit: Payer: Self-pay

## 2022-01-30 VITALS — BP 154/69 | HR 70 | Ht 62.0 in | Wt 125.0 lb

## 2022-01-30 DIAGNOSIS — R1319 Other dysphagia: Secondary | ICD-10-CM

## 2022-01-30 DIAGNOSIS — K625 Hemorrhage of anus and rectum: Secondary | ICD-10-CM | POA: Diagnosis not present

## 2022-01-30 DIAGNOSIS — K219 Gastro-esophageal reflux disease without esophagitis: Secondary | ICD-10-CM | POA: Diagnosis not present

## 2022-01-30 DIAGNOSIS — K921 Melena: Secondary | ICD-10-CM

## 2022-01-30 NOTE — Patient Instructions (Addendum)
Thank you for coming to the office today.  Kelly Sanchez, [place] [address] Hours: 8AM-5PM Phone: [phone]  Stay tuned for apt from them. If not heard back in 1 week, then you can call them.  Hold off on Aspirin for now.  May try Vitamin D supplement 2,000 unit daily.  Please schedule a Follow-up Appointment to: Return if symptoms worsen or fail to improve.  If you have any other questions or concerns, please feel free to call the office or send a message through [place]. You may also schedule an earlier appointment if necessary.  Additionally, you may be receiving a survey about your experience at our office within a few days to 1 week by e-mail or mail. We value your feedback.  Nobie Putnam, DO [place]

## 2022-01-30 NOTE — Progress Notes (Signed)
Subjective:    Patient ID: Kelly Sanchez, female    DOB: 09-20-1951, 71 y.o.   MRN: [id number]  Kelly Sanchez is a 71 y.o. female presenting on 01/30/2022 for Hospitalization Follow-up and Rectal Bleeding   HPI  ED FOLLOW-UP VISIT  Hospital/Location: [facility] ED Date of ED Visit: 01/26/22  Reason for Presenting to ED: Rectal Bleeding  FOLLOW-UP  - ED provider note and record have been reviewed - Patient presents today about 4 days after recent ED visit. Brief summary of recent course, patient had symptoms of rectal bleeding x 1 day bright red blood per rectum, presented to ED on 2/2/3 and transferred from [place] over to [facility], testing in ED with CT abdomen, identified source of bleeding, and had other work up labs urine imaging. Last colonoscopy 6 yr ago, the bleeding had stopped and she was discharged, no further recurrence of bleeding  She was asked to hold Aspirin for now. She was advised to return to GI for consultation and further evaluation.  - Today reports overall has done well after discharge from ED. Symptoms of rectal bleeding have resolved   Note she did accidentally take an Aspirin 87m last night.  No further bleeding  - New medications on discharge: None - Changes to current meds on discharge: HOLD Aspirin 817m Additional history she has had esophageal dysphagia in past and required EGD required to stretch esophagus before. Has been many years, last colonscopy 6 yr ago, requesting new referral.  Tobacco Abuse History of COVID Has tried hypnosis, NRT patches  Additionally with weight loss concerns, reduced appetite. She admits has to make herself eat. 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   Depression screen PHQ 2/9 10/03/2021  Decreased Interest 1  Down, Depressed, Hopeless 2  PHQ - 2 Score 3  Altered sleeping 1  Tired, decreased energy 3  Change in appetite 3  Feeling bad or failure about  yourself  0  Trouble concentrating 0  Moving slowly or fidgety/restless 2  Suicidal thoughts 0  PHQ-9 Score 12  Difficult doing work/chores Somewhat difficult    Social History   Tobacco Use   Smoking status: Every Day    Packs/day: 0.50    Types: Cigarettes   Smokeless tobacco: Never  Vaping Use   Vaping Use: Never used  Substance Use Topics   Alcohol use: No   Drug use: Yes    Types: Marijuana    Review of Systems Per HPI unless specifically indicated above     Objective:    BP (!) 154/69    Pulse 70    Ht 5' 2"  (1.575 m)    Wt 125 lb (56.7 kg)    SpO2 100%    BMI 22.86 kg/m   Wt Readings from Last 3 Encounters:  01/30/22 125 lb (56.7 kg)  12/30/21 125 lb 3.2 oz (56.8 kg)  10/03/21 125 lb 9.6 oz (57 kg)    Physical Exam Vitals and nursing note reviewed.  Constitutional:      General: She is not in acute distress.    Appearance: Normal appearance. She is well-developed. She is not diaphoretic.     Comments: Well-appearing, comfortable, cooperative  HENT:     Head: Normocephalic and atraumatic.  Eyes:     General:        Right eye: No discharge.        Left eye: No discharge.  Conjunctiva/sclera: Conjunctivae normal.  Cardiovascular:     Rate and Rhythm: Normal rate.  Pulmonary:     Effort: Pulmonary effort is normal.  Skin:    General: Skin is warm and dry.     Findings: No erythema or rash.  Neurological:     Mental Status: She is alert and oriented to person, place, and time.  Psychiatric:        Mood and Affect: Mood normal.        Behavior: Behavior normal.        Thought Content: Thought content normal.     Comments: Well groomed, good eye contact, normal speech and thoughts    CTA Abdomen Pelvis Gi Bleed  Result Date: 01/27/2022 EXAM: CTA Abdomen and Pelvis WO and With contrast (GI Bleed protocol) DATE: 01/26/2022 10:02 PM ACCESSION: [id number] UN DICTATED: 01/26/2022 11:27 PM INTERPRETATION LOCATION: [place]   CLINICAL INDICATION: rectal  bleed. brbpr   COMPARISON: CT abdomen pelvis 01/18/2021.   TECHNIQUE: A spiral CT scan was obtained without IV contrast from the lung basesto the pubic symphysis. Images were reconstructed in the axial plane. Next, a spiral CTA scan was obtained with IV contrast from the lung bases to the pubic symphysis in both arterial and delayed venous phases. Images were reconstructed in the axial plane. Multiplanar reformatted and MIP images were provided for further evaluation of the vessels. For selected cases, 3D volume rendered images are also provided.   VASCULAR FINDINGS:   ABDOMINAL AORTA: Patent. Normal caliber. Mild calcified and noncalcified atherosclerotic disease.   CELIAC ARTERY: Patent. SMA: Patent. RENAL ARTERIES: Patent. IMA: Patent.   RIGHT COMMON ILIAC ARTERY: Patent. RIGHT EXTERNAL ILIAC ARTERY: Patent. RIGHT COMMON FEMORAL/PROFUNDA FEMORAL/SFA: Patent. LEFT COMMON ILIAC ARTERY: Patent. LEFT EXTERNAL ILIAC ARTERY: Patent. LEFT COMMON FEMORAL/PROFUNDA FEMORAL/SFA: Patent.   PORTAL/MESENTERIC VEINS: Patent. SYSTEMIC VEINS: Patent.   NON-VASCULAR FINDINGS:   LOWER THORAX: Unremarkable.   HEPATOBILIARY: Hepatic steatosis. No focal hepatic lesions. The gallbladder is surgically absent. Mild central intrahepatic and extrahepatic biliary ductal dilatation, likely secondary to cholecystectomy, findings are improved compared to prior.   SPLEEN: Unremarkable. PANCREAS: Unremarkable.   ADRENALS: Unremarkable. KIDNEYS/URETERS: Bilateral symmetric nephrograms. No evidence of hydroureteronephrosis. No renal calculi.   BLADDER: Nondistended with circumferential wall thickening, likely physiologic.   PELVIC/REPRODUCTIVE ORGANS: The uterus is surgically absent. No concerning adnexal lesions. Findings of pelvic floor laxity.   GI TRACT: No dilated or thick walled loops of bowel. The appendix is surgically absent. Scattered colonic and sigmoid diverticulosis without evidence of diverticulitis.    A small focus of extravasation is noted anteriorly at the level of the rectum likely extending from a distal branch of the IMA (6:168). Small volume hyperdense contrast is noted extending from this location and into the rectum (6:176). No additional sites of GI bleed are identified.   PERITONEUM/RETROPERITONEUM AND MESENTERY: No free air or fluid.   LYMPH NODES: No adenopathy by size criteria.   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 Mild multilevel degenerative changes of the spine. No concerning osseous lesions. Soft tissues are unremarkable.   VASCULAR: --Small focus of GI bleed with extravasation of contrast at the distal rectum. This likely originates from a distal branch of the IMA.   --Mild calcified and noncalcified atherosclerotic disease of the abdominal aorta and its branching vessels.   NON-VASCULAR: --Hepatic steatosis.   --Intra and extrahepatic biliary ductal dilatation as above, decreased compared to prior. Findings are likely secondary to cholecystectomy.   --  Findings suggestive of pelvic floor laxity.   --Additional chronic and incidental findings, as above.   The findings of this study were discussed via telephone with DR. Cyndi Lennert by Dr. Jettie Pagan on 01/26/2022 11:41 PM.  Results for orders placed or performed in visit on 12/30/21  COMPLETE METABOLIC PANEL WITH GFR  Result Value Ref Range   Glucose, Bld 100 65 - 139 mg/dL   BUN 9 7 - 25 mg/dL   Creat 0.97 0.60 - 1.00 mg/dL   eGFR 63 > OR = 60 mL/min/1.58m   BUN/Creatinine Ratio NOT APPLICABLE 6 - 22 (calc)   Sodium 143 135 - 146 mmol/L   Potassium 4.0 3.5 - 5.3 mmol/L   Chloride 110 98 - 110 mmol/L   CO2 26 20 - 32 mmol/L   Calcium 9.2 8.6 - 10.4 mg/dL   Total Protein 6.7 6.1 - 8.1 g/dL   Albumin 4.1 3.6 - 5.1 g/dL   Globulin 2.6 1.9 - 3.7 g/dL (calc)   AG Ratio 1.6 1.0 - 2.5 (calc)   Total Bilirubin 0.4 0.2 - 1.2 mg/dL   Alkaline phosphatase  (APISO) 60 37 - 153 U/L   AST 18 10 - 35 U/L   ALT 12 6 - 29 U/L  Lipid panel  Result Value Ref Range   Cholesterol 147 <200 mg/dL   HDL 52 > OR = 50 mg/dL   Triglycerides 78 <150 mg/dL   LDL Cholesterol (Calc) 79 mg/dL (calc)   Total CHOL/HDL Ratio 2.8 <5.0 (calc)   Non-HDL Cholesterol (Calc) 95 <130 mg/dL (calc)  CBC with Differential/Platelet  Result Value Ref Range   WBC 6.5 3.8 - 10.8 Thousand/uL   RBC 4.30 3.80 - 5.10 Million/uL   Hemoglobin 13.4 11.7 - 15.5 g/dL   HCT 39.8 35.0 - 45.0 %   MCV 92.6 80.0 - 100.0 fL   MCH 31.2 27.0 - 33.0 pg   MCHC 33.7 32.0 - 36.0 g/dL   RDW 12.9 11.0 - 15.0 %   Platelets 180 140 - 400 Thousand/uL   MPV 11.0 7.5 - 12.5 fL   Neutro Abs 3,205 1,500 - 7,800 cells/uL   Lymphs Abs 2,730 850 - 3,900 cells/uL   Absolute Monocytes 384 200 - 950 cells/uL   Eosinophils Absolute 130 15 - 500 cells/uL   Basophils Absolute 52 0 - 200 cells/uL   Neutrophils Relative % 49.3 %   Total Lymphocyte 42.0 %   Monocytes Relative 5.9 %   Eosinophils Relative 2.0 %   Basophils Relative 0.8 %  Hemoglobin A1c  Result Value Ref Range   Hgb A1c MFr Bld 5.4 <5.7 % of total Hgb   Mean Plasma Glucose 108 mg/dL   eAG (mmol/L) 6.0 mmol/L      Assessment & Plan:   Problem List Items Addressed This Visit   None Visit Diagnoses     Rectal bleeding    -  Primary   Relevant Orders   Ambulatory referral to Gastroenterology   Gastroesophageal reflux disease without esophagitis       Relevant Orders   Ambulatory referral to Gastroenterology   Esophageal dysphagia       Relevant Orders   Ambulatory referral to Gastroenterology   Hematochezia       Relevant Orders   Ambulatory referral to Gastroenterology       HFU Hematochezia / GI Bleed - resolved currently no recurrence  referral to GI for consultation from ED follow-up with rectal bleeding hematochezia, GI bleed, esophageal dysphagia required  stretching esophagus before on EGD, may warrant colonoscopy  update (last 6 yr ago) and upper endoscopy. No further bleeding after ED visit, aspirin being held.  Remain off Aspirin now, to avoid recurrence, age 22+ was taking for ASCVD risk.  Counseling on poor appetite, likely with anosmia from covid, and smoking actively impacting this and her poor PO, wt loss. Can be GI related, can review w/ GI as well.  Orders Placed This Encounter  Procedures   Ambulatory referral to Gastroenterology    Referral Priority:   Routine    Referral Type:   Consultation    Referral Reason:   Specialty Services Required    Number of Visits Requested:   1      No orders of the defined types were placed in this encounter.     Follow up plan: Return if symptoms worsen or fail to improve.   Nobie Putnam, [facility] 01/30/2022, 2:47 PM

## 2022-02-07 ENCOUNTER — Other Ambulatory Visit: Payer: Self-pay

## 2022-02-07 ENCOUNTER — Encounter: Payer: Self-pay | Admitting: Family Medicine

## 2022-02-07 ENCOUNTER — Ambulatory Visit (INDEPENDENT_AMBULATORY_CARE_PROVIDER_SITE_OTHER): Payer: Medicare Other | Admitting: Family Medicine

## 2022-02-07 VITALS — Wt 125.0 lb

## 2022-02-07 DIAGNOSIS — B349 Viral infection, unspecified: Secondary | ICD-10-CM | POA: Diagnosis not present

## 2022-02-07 DIAGNOSIS — J029 Acute pharyngitis, unspecified: Secondary | ICD-10-CM | POA: Diagnosis not present

## 2022-02-07 MED ORDER — PREDNISONE 20 MG PO TABS: ORAL_TABLET | ORAL | 0 refills | Status: DC

## 2022-02-07 NOTE — Progress Notes (Signed)
Virtual Visit via Telephone The purpose of this virtual visit is to provide medical care while limiting exposure to the novel coronavirus (COVID19) for both patient and office staff.  Consent was obtained for phone visit:  Yes.   Answered questions that patient had about telehealth interaction:  Yes.   I discussed the limitations, risks, security and privacy concerns of performing an evaluation and management service by telephone. I also discussed with the patient that there may be a patient responsible charge related to this service. The patient expressed understanding and agreed to proceed.  Patient Location: Home Provider Location: [facility] (Office)  Participants in virtual visit: - Patient: Stesha Neyens - CMA: Orinda Kenner, [place] - Provider: Dr Parks Ranger  ---------------------------------------------------------------------- Chief Complaint  Patient presents with   Sore Throat   Nasal Congestion    S: Reviewed CMA documentation. I have called patient and gathered additional HPI as follows:  Acute Pharyngitis Reports that symptoms started 2 days ago with headache, then laryngitis sore throat.  - Tried OTC Tylenol, Ibuprofen 876m alternating Home COVID test negative and Also COVID test negative 01/27/22 Says it does not feel like covid like she has had before  Denies any known or suspected exposure to person with or possibly with COVID19.  Admits chills without fever  Denies any ,weats, body ache, cough, shortness of breath, abdominal pain, diarrhea  Past Medical History:  Diagnosis Date   Arthritis    [place]    Social History   Tobacco Use   Smoking status: Every Day    Packs/day: 0.50    Types: Cigarettes   Smokeless tobacco: Never  Vaping Use   Vaping Use: Never used  Substance Use Topics   Alcohol use: No   Drug use: Yes    Types: Marijuana    Current Outpatient Medications:    aspirin 81 MG chewable tablet, Chew by mouth.,  Disp: , Rfl:    clonazePAM (KLONOPIN) 0.5 MG tablet, Take 1 tablet (0.5 mg total) by mouth 2 (two) times daily as needed for anxiety., Disp: 60 tablet, Rfl: 2   fluticasone (FLONASE) 50 MCG/ACT nasal spray, Place 2 sprays into both nostrils daily. Use for 4-6 weeks then stop and use seasonally or as needed., Disp: 16 g, Rfl: 3   metoprolol succinate (TOPROL-XL) 25 MG 24 hr tablet, Take 25 mg by mouth every morning., Disp: , Rfl:    omeprazole (PRILOSEC) 40 MG capsule, Take 1 capsule (40 mg total) by mouth daily before breakfast., Disp: 90 capsule, Rfl: 3   predniSONE (DELTASONE) 20 MG tablet, Take daily with food. Start with 627m(3 pills) x 2 days, then reduce to 4055m2 pills) x 2 days, then 58m63m pill) x 3 days, Disp: 13 tablet, Rfl: 0   rosuvastatin (CRESTOR) 20 MG tablet, Take 1 tablet (20 mg total) by mouth every evening., Disp: 90 tablet, Rfl: 3  Depression screen PHQ [facility] 02/07/2022 10/03/2021  Decreased Interest 1 1  Down, Depressed, Hopeless 2 2  PHQ - 2 Score 3 3  Altered sleeping 1 1  Tired, decreased energy 3 3  Change in appetite 3 3  Feeling bad or failure about yourself  0 0  Trouble concentrating 0 0  Moving slowly or fidgety/restless 0 2  Suicidal thoughts 0 0  PHQ-9 Score 10 12  Difficult doing work/chores Not difficult at all Somewhat difficult    GAD 7 : Generalized Anxiety Score 02/07/2022 10/03/2021  Nervous, Anxious, on Edge 2 2  Control/stop worrying 2 2  Worry too much - different things 2 2  Trouble relaxing 2 2  Restless 1 1  Easily annoyed or irritable 2 2  Afraid - awful might happen 1 1  Total GAD 7 Score 12 12  Anxiety Difficulty Somewhat difficult Somewhat difficult    -------------------------------------------------------------------------- O: No physical exam performed due to remote telephone encounter.  Lab results reviewed.  Recent Results (from the past 2160 hour(s))  COMPLETE METABOLIC PANEL WITH GFR     Status: None   Collection Time:  12/30/21 11:42 AM  Result Value Ref Range   Glucose, Bld 100 65 - 139 mg/dL    Comment: .        Non-fasting reference interval .    BUN 9 7 - 25 mg/dL   Creat 0.97 0.60 - 1.00 mg/dL   eGFR 63 > OR = 60 mL/min/1.15m    Comment: The eGFR is based on the CKD-EPI 2021 equation. To calculate  the new eGFR from a previous Creatinine or Cystatin C result, go to https://www.kidney.org/professionals/ kdoqi/gfr%5Fcalculator    BUN/Creatinine Ratio NOT APPLICABLE 6 - 22 (calc)   Sodium 143 135 - 146 mmol/L   Potassium 4.0 3.5 - 5.3 mmol/L   Chloride 110 98 - 110 mmol/L   CO2 26 20 - 32 mmol/L   Calcium 9.2 8.6 - 10.4 mg/dL   Total Protein 6.7 6.1 - 8.1 g/dL   Albumin 4.1 3.6 - 5.1 g/dL   Globulin 2.6 1.9 - 3.7 g/dL (calc)   AG Ratio 1.6 1.0 - 2.5 (calc)   Total Bilirubin 0.4 0.2 - 1.2 mg/dL   Alkaline phosphatase (APISO) 60 37 - 153 U/L   AST 18 10 - 35 U/L   ALT 12 6 - 29 U/L  Lipid panel     Status: None   Collection Time: 12/30/21 11:42 AM  Result Value Ref Range   Cholesterol 147 <200 mg/dL   HDL 52 > OR = 50 mg/dL   Triglycerides 78 <150 mg/dL   LDL Cholesterol (Calc) 79 mg/dL (calc)    Comment: Reference range: <100 . Desirable range <100 mg/dL for primary prevention;   <70 mg/dL for patients with CHD or diabetic patients  with > or = 2 CHD risk factors. .Marland KitchenLDL-C is now calculated using the Martin-Hopkins  calculation, which is a validated novel method providing  better accuracy than the Friedewald equation in the  estimation of LDL-C.  MCresenciano Genreet al. JAnnamaria Helling [id number];537(48: 2061-2068  (http://education.[employer].com/faq/FAQ164)    Total CHOL/HDL Ratio 2.8 <5.0 (calc)   Non-HDL Cholesterol (Calc) 95 <130 mg/dL (calc)    Comment: For patients with diabetes plus 1 major ASCVD risk  factor, treating to a non-HDL-C goal of <100 mg/dL  (LDL-C of <70 mg/dL) is considered a therapeutic  option.   CBC with Differential/Platelet     Status: None   Collection Time:  12/30/21 11:42 AM  Result Value Ref Range   WBC 6.5 3.8 - 10.8 Thousand/uL   RBC 4.30 3.80 - 5.10 Million/uL   Hemoglobin 13.4 11.7 - 15.5 g/dL   HCT 39.8 35.0 - 45.0 %   MCV 92.6 80.0 - 100.0 fL   MCH 31.2 27.0 - 33.0 pg   MCHC 33.7 32.0 - 36.0 g/dL   RDW 12.9 11.0 - 15.0 %   Platelets 180 140 - 400 Thousand/uL   MPV 11.0 7.5 - 12.5 fL   Neutro Abs 3,205 1,500 - 7,800 cells/uL   Lymphs Abs 2,730  850 - 3,900 cells/uL   Absolute Monocytes 384 200 - 950 cells/uL   Eosinophils Absolute 130 15 - 500 cells/uL   Basophils Absolute 52 0 - 200 cells/uL   Neutrophils Relative % 49.3 %   Total Lymphocyte 42.0 %   Monocytes Relative 5.9 %   Eosinophils Relative 2.0 %   Basophils Relative 0.8 %  Hemoglobin A1c     Status: None   Collection Time: 12/30/21 11:42 AM  Result Value Ref Range   Hgb A1c MFr Bld 5.4 <5.7 % of total Hgb    Comment: For the purpose of screening for the presence of diabetes: . <5.7%       Consistent with the absence of diabetes 5.7-6.4%    Consistent with increased risk for diabetes             (prediabetes) > or =6.5%  Consistent with diabetes . This assay result is consistent with a decreased risk of diabetes. . Currently, no consensus exists regarding use of hemoglobin A1c for diagnosis of diabetes in children. . According to American Diabetes Association (ADA) guidelines, hemoglobin A1c <7.0% represents optimal control in non-pregnant diabetic patients. Different metrics may apply to specific patient populations.  Standards of Medical Care in Diabetes(ADA). .    Mean Plasma Glucose 108 mg/dL   eAG (mmol/L) 6.0 mmol/L    -------------------------------------------------------------------------- A&P:  Problem List Items Addressed This Visit   None Visit Diagnoses     Acute viral syndrome    -  Primary   Relevant Medications   predniSONE (DELTASONE) 20 MG tablet   Pharyngitis, unspecified etiology          Acute viral syndrome Symptom  onset 2 days ago 2/12 Constellation of viral symptoms Cannot rule out COVID or Flu, but she has home covid negative test now No documented flu or covid vaccine No sick contact  Proceed w/ empiric Prednisone steroid taper over 7 days Start nasal steroid Flonase 2 sprays in each nostril daily for 4-6 weeks, may repeat course seasonally or as needed Tylenol PRN, hold ibuprofen NSAID on steroid Use OTC Mucinex/decongestant PRN  If not improved 48 hours or next week we can consider antibiotic or other therapy if turned into sinusitis or bronchitis.  Meds ordered this encounter  Medications   predniSONE (DELTASONE) 20 MG tablet    Sig: Take daily with food. Start with 14m (3 pills) x 2 days, then reduce to 454m(2 pills) x 2 days, then 2020m1 pill) x 3 days    Dispense:  13 tablet    Refill:  0    Follow-up: - Return in 1 week if not improved  Patient verbalizes understanding with the above medical recommendations including the limitation of remote medical advice.  Specific follow-up and call-back criteria were given for patient to follow-up or seek medical care more urgently if needed.   - Time spent in direct consultation with patient on phone: 8 minutes   AleNobie PutnamO [place] 02/07/2022, 4:37 PM

## 2022-02-10 ENCOUNTER — Encounter: Payer: Self-pay | Admitting: *Deleted

## 2022-02-10 ENCOUNTER — Ambulatory Visit: Payer: Self-pay

## 2022-02-10 DIAGNOSIS — J0110 Acute frontal sinusitis, unspecified: Secondary | ICD-10-CM

## 2022-02-10 MED ORDER — AMOXICILLIN-POT CLAVULANATE 875-125 MG PO TABS: 1.0000 | ORAL_TABLET | Freq: Two times a day (BID) | ORAL | 0 refills | Status: DC

## 2022-02-10 NOTE — Telephone Encounter (Signed)
Attempted to call patient next available chance I had after clinic.  Did not reach her.  Sent rx for Augmentin to her local [facility] pharmacy. I did ask her to contact us back end of week if not improved. However it seems all of these calls took place end of day today after 3pm, and unfortunately cannot respond until done with patients so that is why it took longer.  As I discussed with her at her apt earlier this week her symptoms sounded initially more viral, there are limited other options available as she has tried most therapy for headache.  It does sound more sinus infection at this point so we can try the augmentin.  Nobie Putnam, [facility] 02/10/2022, 6:08 PM

## 2022-02-10 NOTE — Telephone Encounter (Signed)
This encounter was created in error - please disregard.

## 2022-02-10 NOTE — Addendum Note (Signed)
Addended by: Olin Hauser on: 02/10/2022 06:09 PM   Modules accepted: Orders

## 2022-02-10 NOTE — Telephone Encounter (Signed)
Patient's significant other called back to request information from PCP regarding antibiotic requests. Tilden Fossa not on DPR and reports he was not with patient at this time. Ruby Cola reports patient is in bathroom and he is able to receive any information and able to make decisions for patient.reviewed with Ruby Cola NT could not review information regarding patient. No response noted from PCP at this time regarding antibiotics. Encouraged Ruby Cola patient would need appt prior to provider prescribing medications. Ruby Cola reports patient was previously seen and prescribed prednisone and it is not working. Patient continue to have cough . Ruby Cola very upset NT could not go to him and tell him to prescribe what patient is requesting . Attempted to reinforce NT would notify PCP via epic. Brent hung up phone. Please advise and call patient back with information either way whether giving antibiotics or not.

## 2022-02-10 NOTE — Telephone Encounter (Signed)
°  Chief Complaint: medication request Symptoms: still has cough congestion and severe headache Frequency: 3 weeks Pertinent Negatives: NA Disposition: [] ED /[] Urgent Care (no appt availability in office) / [] Appointment(In office/virtual)/ []  [facility]/ [] Home Care/ [] Refused Recommended Disposition /[] [facility] [place] [facility]/ [x]  Follow-up with PCP Additional Notes: pt is stating she cant take the prednisone anymore d/t its making her heart race too bad and she is not getting any better and she is asking specifically for abx to be called into pharmacy. Pt and person in background got ugly with language when I asked about scheduling appt and pt refused and I stated I would send message to Dr. Raliegh Ip and someone f/up. They wanted to speak with someone now. I tried to contact [facility] but no one answered and let them know that and that I would send message and someone can f/up. Pt was ugly and disconnected call.    Reason for Disposition  [1] Caller has URGENT medicine question about med that PCP or specialist prescribed AND [2] triager unable to answer question  Answer Assessment - Initial Assessment Questions 1. NAME of MEDICATION: "What medicine are you calling about?"     prednisone 2. QUESTION: "What is your question?" (e.g., double dose of medicine, side effect)     Making heart race and not helping symptoms 3. PRESCRIBING HCP: "Who prescribed it?" Reason: if prescribed by specialist, call should be referred to that group.     Dr. Raliegh Ip 4. SYMPTOMS: "Do you have any symptoms?"     Still sick  Protocols used: Medication Question Call-A-AH

## 2022-02-14 ENCOUNTER — Telehealth: Payer: Self-pay

## 2022-02-14 NOTE — Telephone Encounter (Signed)
Attempted to contact the patient to schedule her for her medicare wellness visit, no answer or voicemail set-up. If the patient calls back please give her my direct ext. [phone].

## 2022-03-13 DIAGNOSIS — M25561 Pain in right knee: Secondary | ICD-10-CM | POA: Diagnosis not present

## 2022-03-13 DIAGNOSIS — Z6823 Body mass index (BMI) 23.0-23.9, adult: Secondary | ICD-10-CM | POA: Diagnosis not present

## 2022-03-14 DIAGNOSIS — M25561 Pain in right knee: Secondary | ICD-10-CM | POA: Diagnosis not present

## 2022-03-27 DIAGNOSIS — Z66 Do not resuscitate: Secondary | ICD-10-CM | POA: Diagnosis not present

## 2022-03-27 DIAGNOSIS — S8991XA Unspecified injury of right lower leg, initial encounter: Secondary | ICD-10-CM | POA: Diagnosis not present

## 2022-03-27 DIAGNOSIS — M170 Bilateral primary osteoarthritis of knee: Secondary | ICD-10-CM | POA: Diagnosis not present

## 2022-03-27 DIAGNOSIS — M25561 Pain in right knee: Secondary | ICD-10-CM | POA: Diagnosis not present

## 2022-03-27 DIAGNOSIS — M1711 Unilateral primary osteoarthritis, right knee: Secondary | ICD-10-CM | POA: Diagnosis not present

## 2022-03-30 ENCOUNTER — Telehealth: Payer: Self-pay | Admitting: Family Medicine

## 2022-03-30 NOTE — Telephone Encounter (Signed)
N/A unable to leave a message for patient to call back and schedule Medicare Annual Wellness Visit (AWV) to be done virtually or by telephone. ? ?No hx of AWV eligible as of 07/25/17 ? ?Please schedule at anytime with [facility].     ?  ? ?Any questions, please call me at [phone]  ?

## 2022-04-03 ENCOUNTER — Ambulatory Visit: Payer: Medicare Other | Admitting: Family Medicine

## 2022-04-06 ENCOUNTER — Ambulatory Visit: Payer: Medicare Other | Admitting: Family Medicine

## 2022-04-07 ENCOUNTER — Telehealth (INDEPENDENT_AMBULATORY_CARE_PROVIDER_SITE_OTHER): Payer: Medicare Other | Admitting: Family Medicine

## 2022-04-07 ENCOUNTER — Encounter: Payer: Self-pay | Admitting: Family Medicine

## 2022-04-07 VITALS — Wt 125.0 lb

## 2022-04-07 DIAGNOSIS — E782 Mixed hyperlipidemia: Secondary | ICD-10-CM | POA: Diagnosis not present

## 2022-04-07 DIAGNOSIS — F411 Generalized anxiety disorder: Secondary | ICD-10-CM | POA: Diagnosis not present

## 2022-04-07 DIAGNOSIS — F5104 Psychophysiologic insomnia: Secondary | ICD-10-CM | POA: Diagnosis not present

## 2022-04-07 MED ORDER — ROSUVASTATIN CALCIUM 20 MG PO TABS: 20.0000 mg | ORAL_TABLET | Freq: Every evening | ORAL | 12 refills | Status: DC

## 2022-04-07 MED ORDER — CLONAZEPAM 0.5 MG PO TABS: 0.5000 mg | ORAL_TABLET | Freq: Two times a day (BID) | ORAL | 2 refills | Status: DC | PRN

## 2022-04-07 NOTE — Progress Notes (Signed)
Virtual Visit via Telephone ?The purpose of this virtual visit is to provide medical care while limiting exposure to the novel coronavirus (COVID19) for both patient and office staff. ? ?Consent was obtained for phone visit:  Yes.   ?Answered questions that patient had about telehealth interaction:  Yes.   ?I discussed the limitations, risks, security and privacy concerns of performing an evaluation and management service by telephone. I also discussed with the patient that there may be a patient responsible charge related to this service. The patient expressed understanding and agreed to proceed. ? ?Patient Location: Home ?Provider Location: [facility] (Office) ? ?Participants in virtual visit: ?- Patient: Bonnita Nasuti ?- CMA: Orinda Kenner, CMA ?- Provider: Dr Parks Ranger ? ?---------------------------------------------------------------------- ?Chief Complaint  ?Patient presents with  ? Anxiety  ? ? ?S: Reviewed CMA documentation. I have called patient and gathered additional HPI as follows: ? ? ?Anxiety ?Insomnia ?Currently doing well on current therapy. Due for refill now. Continues on Clonazepam  0.'5mg'$  BID regular dosing, controls her insomnia and helps anxiety during daytime. ? ?Note she did take Alprazolam for procedure and it did well but it is not long lasting. ? ?Tried Lunesta, Ambien had grogginess and side effects. ?She has had issues with SSRI in past Lexapro and other medications tried. ? ?Needs refill today has done well in past 3 months ? ?Hyperlipidemia ?Controlled on Rosuvastatin '20mg'$ . Needs new order. ? ?Denies any fevers, chills, sweats, body ache, cough, shortness of breath, sinus pain or pressure, headache, abdominal pain, diarrhea ? ?Past Medical History:  ?Diagnosis Date  ? Arthritis   ? Cancer [facility])   ? ?Social History  ? ?Tobacco Use  ? Smoking status: Every Day  ?  Packs/day: 0.50  ?  Types: Cigarettes  ? Smokeless tobacco: Never  ?Vaping Use  ? Vaping Use: Never used   ?Substance Use Topics  ? Alcohol use: No  ? Drug use: Yes  ?  Types: Marijuana  ? ? ?Current Outpatient Medications:  ?  aspirin 81 MG chewable tablet, Chew by mouth., Disp: , Rfl:  ?  fluticasone (FLONASE) 50 MCG/ACT nasal spray, Place 2 sprays into both nostrils daily. Use for 4-6 weeks then stop and use seasonally or as needed., Disp: 16 g, Rfl: 3 ?  metoprolol succinate (TOPROL-XL) 25 MG 24 hr tablet, Take 25 mg by mouth every morning., Disp: , Rfl:  ?  omeprazole (PRILOSEC) 40 MG capsule, Take 1 capsule (40 mg total) by mouth daily before breakfast., Disp: 90 capsule, Rfl: 3 ?  clonazePAM (KLONOPIN) 0.5 MG tablet, Take 1 tablet (0.5 mg total) by mouth 2 (two) times daily as needed for anxiety., Disp: 60 tablet, Rfl: 2 ?  rosuvastatin (CRESTOR) 20 MG tablet, Take 1 tablet (20 mg total) by mouth every evening., Disp: 30 tablet, Rfl: 12 ? ? ?  02/07/2022  ?  3:36 PM 10/03/2021  ? 11:13 AM  ?Depression screen PHQ 2/9  ?Decreased Interest 1 1  ?Down, Depressed, Hopeless 2 2  ?PHQ - 2 Score 3 3  ?Altered sleeping 1 1  ?Tired, decreased energy 3 3  ?Change in appetite 3 3  ?Feeling bad or failure about yourself  0 0  ?Trouble concentrating 0 0  ?Moving slowly or fidgety/restless 0 2  ?Suicidal thoughts 0 0  ?PHQ-9 Score 10 12  ?Difficult doing work/chores Not difficult at all Somewhat difficult  ? ? ? ?  02/07/2022  ?  3:36 PM 10/03/2021  ? 11:14 AM  ?  GAD 7 : Generalized Anxiety Score  ?Nervous, Anxious, on Edge 2 2  ?Control/stop worrying 2 2  ?Worry too much - different things 2 2  ?Trouble relaxing 2 2  ?Restless 1 1  ?Easily annoyed or irritable 2 2  ?Afraid - awful might happen 1 1  ?Total GAD 7 Score 12 12  ?Anxiety Difficulty Somewhat difficult Somewhat difficult  ? ? ?-------------------------------------------------------------------------- ?O: No physical exam performed due to remote telephone encounter. ? ?Lab results reviewed. ? ?No results found for this or any previous visit (from the past 2160  hour(s)). ? ?-------------------------------------------------------------------------- ?A&P: ? ?Problem List Items Addressed This Visit   ? ? GAD (generalized anxiety disorder) - Primary  ? Relevant Medications  ? clonazePAM (KLONOPIN) 0.5 MG tablet  ? Chronic insomnia  ? Relevant Medications  ? clonazePAM (KLONOPIN) 0.5 MG tablet  ? ?Other Visit Diagnoses   ? ? Mixed hyperlipidemia      ? Relevant Medications  ? rosuvastatin (CRESTOR) 20 MG tablet  ? ?  ? ?GAD ?Insomnia ?  ?Generalized Anxiety  ?Chronic insomnia underlying complication ?Chronic management on BDZ therapy from prior PCP. ?Reviewed past med history prior meds tried / failed before ?Will agree to renew BDZ Clonazepam at current dose 0.'5mg'$  BID regularly dosing, 1 month with 2 refills ordered today after review PDMP ? ?HLD ?Controlled ?Refill Rosuvastatin '20mg'$  daily ? ? ?Meds ordered this encounter  ?Medications  ? clonazePAM (KLONOPIN) 0.5 MG tablet  ?  Sig: Take 1 tablet (0.5 mg total) by mouth 2 (two) times daily as needed for anxiety.  ?  Dispense:  60 tablet  ?  Refill:  2  ? rosuvastatin (CRESTOR) 20 MG tablet  ?  Sig: Take 1 tablet (20 mg total) by mouth every evening.  ?  Dispense:  30 tablet  ?  Refill:  12  ? ? ?Follow-up: ?- Return in 3 months for med refills ? ?Patient verbalizes understanding with the above medical recommendations including the limitation of remote medical advice. ? ?Specific follow-up and call-back criteria were given for patient to follow-up or seek medical care more urgently if needed. ? ? ?- Time spent in direct consultation with patient on phone: 6 minutes ? ? ?Nobie Putnam, DO ?[facility] ?[facility] ?04/07/2022, 4:36 PM ? ?

## 2022-04-16 DIAGNOSIS — M1990 Unspecified osteoarthritis, unspecified site: Secondary | ICD-10-CM | POA: Diagnosis not present

## 2022-04-16 DIAGNOSIS — Z79899 Other long term (current) drug therapy: Secondary | ICD-10-CM | POA: Diagnosis not present

## 2022-04-16 DIAGNOSIS — S300XXA Contusion of lower back and pelvis, initial encounter: Secondary | ICD-10-CM | POA: Diagnosis not present

## 2022-04-16 DIAGNOSIS — S79912A Unspecified injury of left hip, initial encounter: Secondary | ICD-10-CM | POA: Diagnosis not present

## 2022-04-16 DIAGNOSIS — Z87891 Personal history of nicotine dependence: Secondary | ICD-10-CM | POA: Diagnosis not present

## 2022-04-16 DIAGNOSIS — M545 Low back pain, unspecified: Secondary | ICD-10-CM | POA: Diagnosis not present

## 2022-04-16 DIAGNOSIS — I2510 Atherosclerotic heart disease of native coronary artery without angina pectoris: Secondary | ICD-10-CM | POA: Diagnosis not present

## 2022-04-16 DIAGNOSIS — Z7982 Long term (current) use of aspirin: Secondary | ICD-10-CM | POA: Diagnosis not present

## 2022-04-18 ENCOUNTER — Telehealth: Payer: Self-pay

## 2022-04-18 NOTE — Telephone Encounter (Signed)
Attempted to contact the patient, no answer. Mailbox full. If the patient calls back please schedule for her Medicare Annual Wellness Visit (AWV) virtually, telephone or face to face. ?  ?Donnie Mesa, [place] ?[phone]  ?

## 2022-04-24 ENCOUNTER — Ambulatory Visit: Payer: Medicare Other

## 2022-04-24 NOTE — Progress Notes (Signed)
The pt was scheduled for her medicare wellness visit over the phone at 10:40am. I called her at 10:38am  and she informed me that she was sleep and that I needed to call back later. I confirmed that 2:40pm today would be a better time to call the patient back. I called the patient back at 2:40pm and she informed me that she was unsure why I was calling because she was no longer a patient of [facility]. She said she changed her primary care provider several months ago because she was not pleased with [facility]. The pt didn't want to proceed with the medicare wellness visit today.   ?

## 2022-04-26 DIAGNOSIS — Z6824 Body mass index (BMI) 24.0-24.9, adult: Secondary | ICD-10-CM | POA: Diagnosis not present

## 2022-04-26 DIAGNOSIS — S300XXD Contusion of lower back and pelvis, subsequent encounter: Secondary | ICD-10-CM | POA: Diagnosis not present

## 2022-04-26 DIAGNOSIS — R634 Abnormal weight loss: Secondary | ICD-10-CM | POA: Diagnosis not present

## 2022-04-26 DIAGNOSIS — I25119 Atherosclerotic heart disease of native coronary artery with unspecified angina pectoris: Secondary | ICD-10-CM | POA: Diagnosis not present

## 2022-04-26 DIAGNOSIS — Z79899 Other long term (current) drug therapy: Secondary | ICD-10-CM | POA: Diagnosis not present

## 2022-04-26 DIAGNOSIS — Z1231 Encounter for screening mammogram for malignant neoplasm of breast: Secondary | ICD-10-CM | POA: Diagnosis not present

## 2022-04-27 DIAGNOSIS — I700 Atherosclerosis of aorta: Secondary | ICD-10-CM | POA: Diagnosis not present

## 2022-04-27 DIAGNOSIS — S3983XD Other specified injuries of pelvis, subsequent encounter: Secondary | ICD-10-CM | POA: Diagnosis not present

## 2022-04-27 DIAGNOSIS — K838 Other specified diseases of biliary tract: Secondary | ICD-10-CM | POA: Diagnosis not present

## 2022-04-27 DIAGNOSIS — S300XXS Contusion of lower back and pelvis, sequela: Secondary | ICD-10-CM | POA: Diagnosis not present

## 2022-04-27 DIAGNOSIS — S300XXD Contusion of lower back and pelvis, subsequent encounter: Secondary | ICD-10-CM | POA: Diagnosis not present

## 2022-04-28 DIAGNOSIS — S7002XA Contusion of left hip, initial encounter: Secondary | ICD-10-CM | POA: Diagnosis not present

## 2022-04-28 DIAGNOSIS — Z6823 Body mass index (BMI) 23.0-23.9, adult: Secondary | ICD-10-CM | POA: Diagnosis not present

## 2022-04-28 DIAGNOSIS — I2510 Atherosclerotic heart disease of native coronary artery without angina pectoris: Secondary | ICD-10-CM | POA: Diagnosis not present

## 2022-05-01 DIAGNOSIS — R5383 Other fatigue: Secondary | ICD-10-CM | POA: Diagnosis not present

## 2022-05-04 DIAGNOSIS — E785 Hyperlipidemia, unspecified: Secondary | ICD-10-CM | POA: Diagnosis not present

## 2022-05-04 DIAGNOSIS — Z79899 Other long term (current) drug therapy: Secondary | ICD-10-CM | POA: Diagnosis not present

## 2022-05-04 DIAGNOSIS — K219 Gastro-esophageal reflux disease without esophagitis: Secondary | ICD-10-CM | POA: Diagnosis not present

## 2022-05-04 DIAGNOSIS — M19041 Primary osteoarthritis, right hand: Secondary | ICD-10-CM | POA: Diagnosis not present

## 2022-05-04 DIAGNOSIS — I10 Essential (primary) hypertension: Secondary | ICD-10-CM | POA: Diagnosis not present

## 2022-05-04 DIAGNOSIS — M19042 Primary osteoarthritis, left hand: Secondary | ICD-10-CM | POA: Diagnosis not present

## 2022-05-04 DIAGNOSIS — S7002XA Contusion of left hip, initial encounter: Secondary | ICD-10-CM | POA: Diagnosis not present

## 2022-05-04 DIAGNOSIS — M170 Bilateral primary osteoarthritis of knee: Secondary | ICD-10-CM | POA: Diagnosis not present

## 2022-05-04 DIAGNOSIS — S300XXA Contusion of lower back and pelvis, initial encounter: Secondary | ICD-10-CM | POA: Diagnosis not present

## 2022-05-04 DIAGNOSIS — Z7982 Long term (current) use of aspirin: Secondary | ICD-10-CM | POA: Diagnosis not present

## 2022-05-04 DIAGNOSIS — Z87891 Personal history of nicotine dependence: Secondary | ICD-10-CM | POA: Diagnosis not present

## 2022-05-04 DIAGNOSIS — I2510 Atherosclerotic heart disease of native coronary artery without angina pectoris: Secondary | ICD-10-CM | POA: Diagnosis not present

## 2022-05-23 DIAGNOSIS — L7632 Postprocedural hematoma of skin and subcutaneous tissue following other procedure: Secondary | ICD-10-CM | POA: Diagnosis not present

## 2022-05-23 DIAGNOSIS — Z6824 Body mass index (BMI) 24.0-24.9, adult: Secondary | ICD-10-CM | POA: Diagnosis not present

## 2022-08-30 DIAGNOSIS — Z1211 Encounter for screening for malignant neoplasm of colon: Secondary | ICD-10-CM | POA: Diagnosis not present

## 2022-08-30 DIAGNOSIS — J019 Acute sinusitis, unspecified: Secondary | ICD-10-CM | POA: Diagnosis not present

## 2022-10-27 DIAGNOSIS — F17290 Nicotine dependence, other tobacco product, uncomplicated: Secondary | ICD-10-CM | POA: Diagnosis not present

## 2022-10-27 DIAGNOSIS — M1990 Unspecified osteoarthritis, unspecified site: Secondary | ICD-10-CM | POA: Diagnosis not present

## 2022-10-27 DIAGNOSIS — E559 Vitamin D deficiency, unspecified: Secondary | ICD-10-CM | POA: Diagnosis not present

## 2022-10-27 DIAGNOSIS — Z1211 Encounter for screening for malignant neoplasm of colon: Secondary | ICD-10-CM | POA: Diagnosis not present

## 2022-10-27 DIAGNOSIS — I25119 Atherosclerotic heart disease of native coronary artery with unspecified angina pectoris: Secondary | ICD-10-CM | POA: Diagnosis not present

## 2022-11-09 DIAGNOSIS — M25511 Pain in right shoulder: Secondary | ICD-10-CM | POA: Diagnosis not present

## 2022-11-09 DIAGNOSIS — R0609 Other forms of dyspnea: Secondary | ICD-10-CM | POA: Diagnosis not present

## 2022-11-09 DIAGNOSIS — I25119 Atherosclerotic heart disease of native coronary artery with unspecified angina pectoris: Secondary | ICD-10-CM | POA: Diagnosis not present

## 2022-11-09 DIAGNOSIS — M25512 Pain in left shoulder: Secondary | ICD-10-CM | POA: Diagnosis not present

## 2022-11-11 DIAGNOSIS — Z720 Tobacco use: Secondary | ICD-10-CM | POA: Diagnosis not present

## 2022-11-11 DIAGNOSIS — Z87891 Personal history of nicotine dependence: Secondary | ICD-10-CM | POA: Diagnosis not present

## 2022-11-11 DIAGNOSIS — R911 Solitary pulmonary nodule: Secondary | ICD-10-CM | POA: Diagnosis not present

## 2022-11-21 DIAGNOSIS — U071 COVID-19: Secondary | ICD-10-CM | POA: Diagnosis not present

## 2022-12-04 ENCOUNTER — Telehealth: Payer: Self-pay

## 2022-12-04 NOTE — Telephone Encounter (Signed)
I attempted to contact the patient to schedule their Medicare Annual Wellness Visit (AWV) virtually, by telephone, or face-to-face. No answer, voicemail not set up.    Kelly Sanchez, [place] [phone]

## 2022-12-15 DIAGNOSIS — R0609 Other forms of dyspnea: Secondary | ICD-10-CM | POA: Diagnosis not present

## 2022-12-15 DIAGNOSIS — I25119 Atherosclerotic heart disease of native coronary artery with unspecified angina pectoris: Secondary | ICD-10-CM | POA: Diagnosis not present

## 2023-01-09 DIAGNOSIS — R6889 Other general symptoms and signs: Secondary | ICD-10-CM | POA: Diagnosis not present

## 2023-01-09 DIAGNOSIS — I25119 Atherosclerotic heart disease of native coronary artery with unspecified angina pectoris: Secondary | ICD-10-CM | POA: Diagnosis not present

## 2023-01-09 DIAGNOSIS — E559 Vitamin D deficiency, unspecified: Secondary | ICD-10-CM | POA: Diagnosis not present

## 2023-01-12 DIAGNOSIS — I25119 Atherosclerotic heart disease of native coronary artery with unspecified angina pectoris: Secondary | ICD-10-CM | POA: Diagnosis not present

## 2023-01-12 DIAGNOSIS — E785 Hyperlipidemia, unspecified: Secondary | ICD-10-CM | POA: Diagnosis not present

## 2023-01-12 DIAGNOSIS — R030 Elevated blood-pressure reading, without diagnosis of hypertension: Secondary | ICD-10-CM | POA: Diagnosis not present

## 2023-02-21 DIAGNOSIS — I25119 Atherosclerotic heart disease of native coronary artery with unspecified angina pectoris: Secondary | ICD-10-CM | POA: Diagnosis not present

## 2023-02-21 DIAGNOSIS — Z600 Problems of adjustment to life-cycle transitions: Secondary | ICD-10-CM | POA: Diagnosis not present

## 2023-02-21 DIAGNOSIS — E7800 Pure hypercholesterolemia, unspecified: Secondary | ICD-10-CM | POA: Diagnosis not present

## 2023-02-21 DIAGNOSIS — E559 Vitamin D deficiency, unspecified: Secondary | ICD-10-CM | POA: Diagnosis not present

## 2023-02-21 DIAGNOSIS — K144 Atrophy of tongue papillae: Secondary | ICD-10-CM | POA: Diagnosis not present

## 2023-02-21 DIAGNOSIS — M159 Polyosteoarthritis, unspecified: Secondary | ICD-10-CM | POA: Diagnosis not present

## 2023-02-21 DIAGNOSIS — Z0000 Encounter for general adult medical examination without abnormal findings: Secondary | ICD-10-CM | POA: Diagnosis not present

## 2023-02-21 DIAGNOSIS — D508 Other iron deficiency anemias: Secondary | ICD-10-CM | POA: Diagnosis not present

## 2023-02-21 DIAGNOSIS — R7303 Prediabetes: Secondary | ICD-10-CM | POA: Diagnosis not present

## 2023-03-02 DIAGNOSIS — D508 Other iron deficiency anemias: Secondary | ICD-10-CM | POA: Diagnosis not present

## 2023-03-02 DIAGNOSIS — K144 Atrophy of tongue papillae: Secondary | ICD-10-CM | POA: Diagnosis not present

## 2023-03-02 DIAGNOSIS — M1611 Unilateral primary osteoarthritis, right hip: Secondary | ICD-10-CM | POA: Diagnosis not present

## 2023-03-02 DIAGNOSIS — R7303 Prediabetes: Secondary | ICD-10-CM | POA: Diagnosis not present

## 2023-03-02 DIAGNOSIS — M25551 Pain in right hip: Secondary | ICD-10-CM | POA: Diagnosis not present

## 2023-03-05 ENCOUNTER — Emergency Department
Admission: EM | Admit: 2023-03-05 | Discharge: 2023-03-05 | Disposition: A | Discharge status: Home / Self Care | Payer: No Typology Code available for payment source | Attending: Emergency Medicine | Admitting: Emergency Medicine

## 2023-03-05 ENCOUNTER — Emergency Department: Payer: No Typology Code available for payment source

## 2023-03-05 DIAGNOSIS — R7989 Other specified abnormal findings of blood chemistry: Secondary | ICD-10-CM | POA: Insufficient documentation

## 2023-03-05 DIAGNOSIS — N12 Tubulo-interstitial nephritis, not specified as acute or chronic: Secondary | ICD-10-CM

## 2023-03-05 DIAGNOSIS — K838 Other specified diseases of biliary tract: Secondary | ICD-10-CM | POA: Diagnosis not present

## 2023-03-05 DIAGNOSIS — N10 Acute tubulo-interstitial nephritis: Secondary | ICD-10-CM | POA: Diagnosis not present

## 2023-03-05 DIAGNOSIS — R944 Abnormal results of kidney function studies: Secondary | ICD-10-CM | POA: Diagnosis not present

## 2023-03-05 DIAGNOSIS — Z9049 Acquired absence of other specified parts of digestive tract: Secondary | ICD-10-CM | POA: Diagnosis not present

## 2023-03-05 DIAGNOSIS — N390 Urinary tract infection, site not specified: Secondary | ICD-10-CM | POA: Diagnosis not present

## 2023-03-05 HISTORY — DX: Gastro-esophageal reflux disease without esophagitis: K21.9

## 2023-03-05 HISTORY — DX: Hyperlipidemia, unspecified: E78.5

## 2023-03-05 HISTORY — DX: Essential (primary) hypertension: I10

## 2023-03-05 LAB — CBC AND DIFFERENTIAL
Absolute NRBC: 0 10*3/uL (ref 0.00–0.00)
Basophils Absolute Automated: 0.04 10*3/uL (ref 0.00–0.08)
Basophils Automated: 0.6 %
Eosinophils Absolute Automated: 0.15 10*3/uL (ref 0.00–0.44)
Eosinophils Automated: 2.2 %
Hematocrit: 37.3 % (ref 34.7–43.7)
Hgb: 12.8 g/dL (ref 11.4–14.8)
Immature Granulocytes Absolute: 0.01 10*3/uL (ref 0.00–0.07)
Immature Granulocytes: 0.1 %
Instrument Absolute Neutrophil Count: 3.13 10*3/uL (ref 1.10–6.33)
Lymphocytes Absolute Automated: 2.89 10*3/uL (ref 0.42–3.22)
Lymphocytes Automated: 43.2 %
MCH: 31.1 pg (ref 25.1–33.5)
MCHC: 34.3 g/dL (ref 31.5–35.8)
MCV: 90.8 fL (ref 78.0–96.0)
MPV: 10.9 fL (ref 8.9–12.5)
Monocytes Absolute Automated: 0.47 10*3/uL (ref 0.21–0.85)
Monocytes: 7 %
Neutrophils Absolute: 3.13 10*3/uL (ref 1.10–6.33)
Neutrophils: 46.9 %
Nucleated RBC: 0 /100 WBC (ref 0.0–0.0)
Platelets: 149 10*3/uL (ref 142–346)
RBC: 4.11 10*6/uL (ref 3.90–5.10)
RDW: 13 % (ref 11–15)
WBC: 6.69 10*3/uL (ref 3.10–9.50)

## 2023-03-05 LAB — URINALYSIS WITH REFLEX TO MICROSCOPIC EXAM - REFLEX TO CULTURE
Bilirubin, UA: NEGATIVE
Glucose, UA: NEGATIVE
Ketones UA: NEGATIVE
Nitrite, UA: NEGATIVE
Specific Gravity UA: 1.009 (ref 1.001–1.035)
Urine pH: 6 (ref 5.0–8.0)
Urobilinogen, UA: NORMAL mg/dL (ref 0.2–2.0)

## 2023-03-05 LAB — COMPREHENSIVE METABOLIC PANEL
ALT: 15 U/L (ref 0–55)
AST (SGOT): 22 U/L (ref 5–41)
Albumin/Globulin Ratio: 1.4 (ref 0.9–2.2)
Albumin: 3.8 g/dL (ref 3.5–5.0)
Alkaline Phosphatase: 66 U/L (ref 37–117)
Anion Gap: 9 (ref 5.0–15.0)
BUN: 10 mg/dL (ref 7.0–21.0)
Bilirubin, Total: 0.5 mg/dL (ref 0.2–1.2)
CO2: 25 mEq/L (ref 17–29)
Calcium: 9.8 mg/dL (ref 7.9–10.2)
Chloride: 110 mEq/L (ref 99–111)
Creatinine: 1.4 mg/dL — ABNORMAL HIGH (ref 0.4–1.0)
Globulin: 2.7 g/dL (ref 2.0–3.6)
Glucose: 87 mg/dL (ref 70–100)
Potassium: 4.7 mEq/L (ref 3.5–5.3)
Protein, Total: 6.5 g/dL (ref 6.0–8.3)
Sodium: 144 mEq/L (ref 135–145)
eGFR: 40.1 mL/min/{1.73_m2} — AB (ref >=60)

## 2023-03-05 MED ORDER — TRAMADOL HCL 50 MG PO TABS
50.0000 mg | ORAL_TABLET | Freq: Once | ORAL | Status: AC
Start: 2023-03-05 — End: 2023-03-05
  Administered 2023-03-05: 50 mg via ORAL
  Filled 2023-03-05: qty 1

## 2023-03-05 MED ORDER — SODIUM CHLORIDE 0.9 % IV BOLUS
1000.0000 mL | Freq: Once | INTRAVENOUS | Status: AC
Start: 2023-03-05 — End: 2023-03-05
  Administered 2023-03-05: 1000 mL via INTRAVENOUS

## 2023-03-05 MED ORDER — CEFDINIR 300 MG PO CAPS
300.0000 mg | ORAL_CAPSULE | Freq: Two times a day (BID) | ORAL | 0 refills | Status: AC
Start: 2023-03-05 — End: 2023-03-12

## 2023-03-05 MED ORDER — ACETAMINOPHEN 500 MG PO TABS
1000.0000 mg | ORAL_TABLET | Freq: Once | ORAL | Status: AC
Start: 2023-03-05 — End: 2023-03-05
  Administered 2023-03-05: 1000 mg via ORAL
  Filled 2023-03-05: qty 2

## 2023-03-05 MED ORDER — STERILE WATER FOR INJECTION IJ/IV SOLN (WRAP)
1.0000 g | Freq: Once | INTRAMUSCULAR | Status: AC
Start: 2023-03-05 — End: 2023-03-05
  Administered 2023-03-05: 1 g via INTRAVENOUS
  Filled 2023-03-05: qty 1000

## 2023-03-05 NOTE — Discharge Instructions (Addendum)
Until you get your kidney function test redone (creatinine) you should avoid ibuprofen/advil/motrin/aleve.     You can take tylenol and also take the tramadol your doctor from home has given you.    Stay hydrated! It will flush out the infection and protect your kidney.    Return if you are vomiting, having fevers, have worse pain in your back, or your urination isn't getting better in 2-3 days.

## 2023-03-05 NOTE — ED Provider Notes (Signed)
EMERGENCY DEPARTMENT HISTORY AND PHYSICAL EXAM    Date: 03/05/2023  Patient Name: Tracie Miranda  Attending Physician:  Kaleen Odea, MD    History of Presenting Illness     Chief Complaint   Patient presents with    Flank Pain    Urinary Tract Infection Symptoms     History Provided By: patient with her friend  Chief Complaint: uti sxs    Additional History: Tracie Miranda is a 72 y.o. female. Lives in [place] and is here visiting friends of her friend/caretaker for 2 weeks. For the last few days she has had lower back pain on both sides with urinary discomfort. No fevers but has had chills. No blood in urine. Doesn't typically get recurrent utis. No  diarrhea/vomiting/diarrhea and no blood in urine. No dizziness. Taking her routine meds.    PCP: Pcp, [place], MD    No current facility-administered medications for this encounter.    Current Outpatient Medications:     metoprolol succinate XL (TOPROL-XL) 25 MG 24 hr tablet, , Disp: , Rfl:     omeprazole (PriLOSEC) 40 MG capsule, Take 1 capsule (40 mg) by mouth daily, Disp: , Rfl:     rosuvastatin (CRESTOR) 40 MG tablet, , Disp: , Rfl:     aspirin 81 MG chewable tablet, Chew 1 tablet (81 mg) by mouth daily, Disp: , Rfl:     cefdinir (OMNICEF) 300 MG capsule, Take 1 capsule (300 mg) by mouth 2 (two) times daily for 7 days, Disp: 14 capsule, Rfl: 0    Past Medical History     Past Medical History:   Diagnosis Date    Gastroesophageal reflux disease     Hyperlipidemia     Hypertension      History reviewed. No pertinent surgical history.    Family History     History reviewed. No pertinent family history.    Social History     Social History     Socioeconomic History    Marital status: Divorced     Spouse name: Not on file    Number of children: Not on file    Years of education: Not on file    Highest education level: Not on file   Occupational History    Not on file   Tobacco Use    Smoking status: Some Days     Types: Cigarettes    Smokeless tobacco: Never    Vaping Use    Vaping Use: Every day   Substance and Sexual Activity    Alcohol use: Not Currently    Drug use: Yes     Comment: daily marijuana    Sexual activity: Not on file   Other Topics Concern    Not on file   Social History Narrative    Not on file     Social Determinants of Health     Financial Resource Strain: Not on file   Food Insecurity: No Food Insecurity (03/05/2023)    Hunger Vital Sign     Worried About Running Out of Food in the Last Year: Never true     Ran Out of Food in the Last Year: Never true   Transportation Needs: Not on file   Physical Activity: Not on file   Stress: Not on file   Social Connections: Not on file   Intimate Partner Violence: Not At Risk (03/05/2023)    Humiliation, Afraid, Rape, and Kick questionnaire     Fear of Current or Ex-Partner: No  Emotionally Abused: No     Physically Abused: No     Sexually Abused: No   Housing Stability: Not on file         Allergies     No Known Allergies    Physical Exam     BP 138/63   Pulse (!) 55   Temp 97.7 F (36.5 C) (Oral)   Resp 18   Ht '5\' 3"'$  (1.6 m)   Wt 59.9 kg   SpO2 97%   BMI 23.38 kg/m   Pulse Oximetry Analysis - Normal 99% On RA    Physical Exam   Constitutional: She appears well-developed and well-nourished. mmm  Head: Normocephalic and atraumatic.   Eyes: No scleral icterus.   Neck: Normal range of motion. Neck supple.   Cardiovascular: Intact distal pulses.    Pulmonary/Chest: Effort normal. No respiratory distress.   Neurological: She is alert and oriented to person, place, and time.   Skin: Skin is warm and dry.   Psychiatric: She has a normal mood and affect. Her behavior is normal. Judgment and thought content normal.   Well healed superior surgical scar ruq and lower abd, no upper ttp. Mild lower abd ttp    Diagnostic Study Results     Labs -  Results       Procedure Component Value Units Date/Time    Comprehensive metabolic panel [id number]  (Abnormal) Collected: 03/05/23 1859    Specimen: Blood Updated:  03/05/23 1924     Glucose 87 mg/dL      BUN 10.0 mg/dL      Creatinine 1.4 mg/dL      Sodium 144 mEq/L      Potassium 4.7 mEq/L      Chloride 110 mEq/L      CO2 25 mEq/L      Calcium 9.8 mg/dL      Protein, Total 6.5 g/dL      Albumin 3.8 g/dL      AST (SGOT) 22 U/L      ALT 15 U/L      Alkaline Phosphatase 66 U/L      Bilirubin, Total 0.5 mg/dL      Globulin 2.7 g/dL      Albumin/Globulin Ratio 1.4     Anion Gap 9.0     eGFR 40.1 mL/min/1.73 m2     Urinalysis Reflex to Microscopic Exam- Reflex to Culture [id number]  (Abnormal) Collected: 03/05/23 1859     Updated: 03/05/23 1908     Urine Type Urine, Clean Ca     Color, UA Colorless     Clarity, UA Hazy     Specific Gravity UA 1.009     Urine pH 6.0     Leukocyte Esterase, UA Large     Nitrite, UA Negative     Protein, UR 30= 1+     Glucose, UA Negative     Ketones UA Negative     Urobilinogen, UA Normal mg/dL      Bilirubin, UA Negative     Blood, UA Moderate     RBC, UA 11-25 /hpf      WBC, UA TNTC /hpf      Squamous Epithelial Cells, Urine 0-5 /hpf     CBC and differential [id number] Collected: 03/05/23 1859    Specimen: Blood Updated: 03/05/23 1904     WBC 6.69 x10 3/uL      Hgb 12.8 g/dL      Hematocrit 37.3 %      Platelets 149 x10  3/uL      RBC 4.11 x10 6/uL      MCV 90.8 fL      MCH 31.1 pg      MCHC 34.3 g/dL      RDW 13 %      MPV 10.9 fL      Instrument Absolute Neutrophil Count 3.13 x10 3/uL      Neutrophils 46.9 %      Lymphocytes Automated 43.2 %      Monocytes 7.0 %      Eosinophils Automated 2.2 %      Basophils Automated 0.6 %      Immature Granulocytes 0.1 %      Nucleated RBC 0.0 /100 WBC      Neutrophils Absolute 3.13 x10 3/uL      Lymphocytes Absolute Automated 2.89 x10 3/uL      Monocytes Absolute Automated 0.47 x10 3/uL      Eosinophils Absolute Automated 0.15 x10 3/uL      Basophils Absolute Automated 0.04 x10 3/uL      Immature Granulocytes Absolute 0.01 x10 3/uL      Absolute NRBC 0.00 x10 3/uL             Radiologic Studies -   Radiology  Results (24 Hour)       Procedure Component Value Units Date/Time    CT Abd/Pelvis without Contrast [id number] Collected: 03/05/23 2012    Order Status: Completed Updated: 03/05/23 2017    Narrative:      HISTORY:  Urinary tract infection elevated creatinine    COMPARISON: None available.    TECHNIQUE: CT of the abdomen and pelvis performed without intravenous  contrast. The following dose reduction techniques were utilized: automated  exposure control and/or adjustment of the mA and/or KV according to patient  size, and the use of an iterative reconstruction technique.    CONTRAST: None.    FINDINGS: Vascular and organ detail is limited without IV contrast.      ABDOMEN FINDINGS:     LUNG BASES: Normal.     ADRENALS: Normal.     LIVER: Normal. No focal mass seen. Normal contour and density.    GALLBLADDER: Not well seen and either resected or contracted.    COMMON DUCT: Moderately dilated to 12 mm. No definite intrahepatic or  ductal dilatation.    PANCREAS: Normal. No focal mass seen. Normal contour and size. No  peripancreatic stranding.    SPLEEN: Normal size, shape and appearance.    RIGHT KIDNEY: Normal. No focal mass or significant hydronephrosis or  obstructing stone.     LEFT KIDNEY: Normal. No focal mass or significant hydronephrosis or  obstructing stone.     IVC: Normal.    ABDOMINAL AORTA: Normal.    BOWEL: Normal. No small bowel obstruction or wall thickening.    BONES: Normal.    NODES: Normal.     PELVIC FINDINGS:    BLADDER AND URETERS: Normal.    VESSELS: Normal.    NODES: Normal.    UTERUS AND ADNEXA: Uterus not well seen. No focal mass in the midline of  the pelvis or in the adnexa..    BOWEL: Moderate colorectal stool burden Appendix not well seen. No colonic  obstruction or inflammatory change. Several colonic diverticula noted    BONES: There are degenerative changes of the bony spine and pelvis      Impression:         1. Dilated common bile duct is nonspecific but  sometimes physiologic  in  patient status post cholecystectomy. Correlation with LFTs is recommended  as occasionally this can be due to a distal biliary stricture or an  noncalcified choledocholithiasis.    2. Otherwise unremarkable noncontrast appearance of the abdomen and    Note: Note that CT scanning at this site  utilizes multiple dose reduction  techniques including automatic exposure control, adjustment of the MAA  and/or KVP according to patient's size and use of iterative reconstruction  technique     Elyn Peers, MD  03/05/2023 8:15 PM        .    Doctor's Notes     Throughout the stay in the Emergency Department, questions and concerns surrounding pain control, care plans, diagnostic studies, effects of medications administered or prescribed, and future prognostic dilemmas were assessed and addressed.    VITALS:  Patient Vitals for the past 12 hrs:   BP Temp Pulse Resp   03/05/23 1931 138/63 -- (!) 55 --   03/05/23 1819 164/73 97.7 F (36.5 C) (!) 52 18     ----------MEDICAL DECISION MAKING----------    I personally reviewed the past medical history, social history, family history, and social determinants of health listed above. All labs and imaging in this ED provider note were reviewed as part of my medical decision making. Any personally interpreted imaging or lab/ekg/monitor/vitals will be noted below (pulse ox interpretation above).     OUR OLD RECORDS: none over 5 years    OUTSIDE RECORDS: we have access to visits to doctors in [place]. Last visit was for Hip pain on 03/02/23 and she had xr imaging ordered. Per their notes: h/o CAD, cervical cancer, esophageal stricture, gallstones, fractures, OA. Also gest rx for albuterol, fluticasone, prilosec, crestor, vitamins, klonopin, and recently tramadol. Last cr per care everywhere that I can see is 0.76 on 01/09/23    INITIAL IMP & PLAN: will do labs and compare to her labs from [facility] with attn to cbc/renal fxn.  Screen urine and give ivf and empiric abx. Pain meds pending renal fxn  tests. Doesn't appear sepsic. Stable vitals.    ED COURSE & INTERPRETED RESULTS:     --> all c/w urine infection. Has pain and cr elevated to twice the value of January so will do tramadol and tylenol and do ct imaging. (Last cr in care everywhere is 0.76).    ED MEDICATIONS:   Medications   sodium chloride 0.9 % bolus 1,000 mL (1,000 mLs Intravenous New Bag 03/05/23 1924)   cefTRIAXone (ROCEPHIN) 1 g in sterile water (preservative free) 10 mL IV push injection (1 g Intravenous Given 03/05/23 1925)   traMADol (ULTRAM) tablet 50 mg (50 mg Oral Given 03/05/23 1939)   acetaminophen (TYLENOL) tablet 1,000 mg (1,000 mg Oral Given 03/05/23 1939)     ED PRESCRIPTIONS:   New Prescriptions    CEFDINIR (OMNICEF) 300 MG CAPSULE    Take 1 capsule (300 mg) by mouth 2 (two) times daily for 7 days     [place]: visiting from out of town so return precautions for re-eval here as needed    _______________________________    8:34 PM - Rx use and side effects, results, home self care, discharge instructions, and return precautions discussed extensively with patient with friend/caretaker at bedside (whose family they are visiting up here). Possibility of evolving illness reviewed. All questions solicited and addressed. Patient is amenable to discharge. Discussed avoiding nsaids (she uses  them sometimes but not all the time) given her renal fxn and focus on hydration. Current renal fxn means I can't give her pyridium. So will recommend tylenol primarily and she las a limited amount of tramadol from prior rx.    _______________________________  Medical DeMedical Decision Makingcision Making  Attestations:    Kaleen Odea, MD is the primary emergency doctor of record.    _______________________________    Diagnosis and Disposition     Final ED Diagnosis:   1. Pyelonephritis    2. Elevated serum creatinine        Disposition:   ED Disposition       ED Disposition    Discharge    Condition   --    Date/Time   Mon Mar 05, 2023  8:32 PM    Comment   Tracie Miranda discharge to home/self care.    Condition at disposition: Stable                        Roena Malady, MD  03/07/23 1353

## 2023-03-09 NOTE — Progress Notes (Signed)
No further follow-up required.  Reviewed by Dr. Boyd Kerbs

## 2023-04-08 IMAGING — CT CT CERVICAL SPINE W/O CM
3 of 4 series · 13 of 33 positions shown, 16 images · non-contrast
Comparison: None.

CLINICAL DATA: Neck injury after fall yesterday.

EXAM:
CT HEAD WITHOUT CONTRAST
CT CERVICAL SPINE WITHOUT CONTRAST
TECHNIQUE: Multidetector CT imaging of the head and cervical spine was
performed following the standard protocol without intravenous
contrast. Multiplanar CT image reconstructions of the cervical spine
were also generated.

[Series 6: sagittal bone · sagittal · 0.23mm/px · 5 of 71 slices shown, 6 images]
[im 24/71  bone]
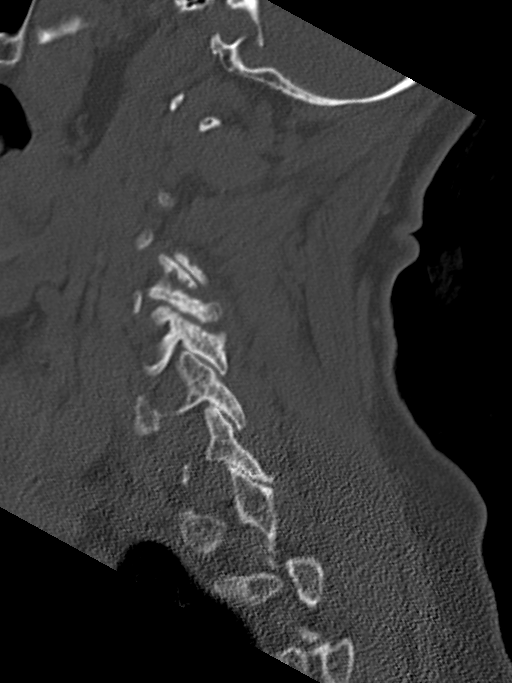
[im 30/71  bone]
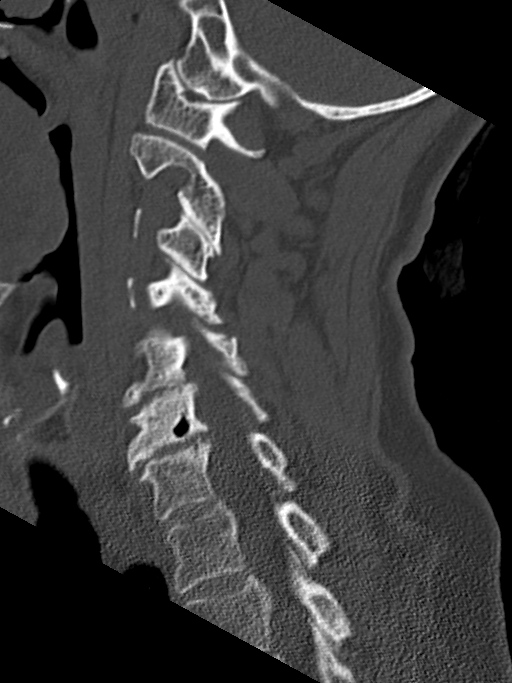
[im 36/71  soft-tissue]
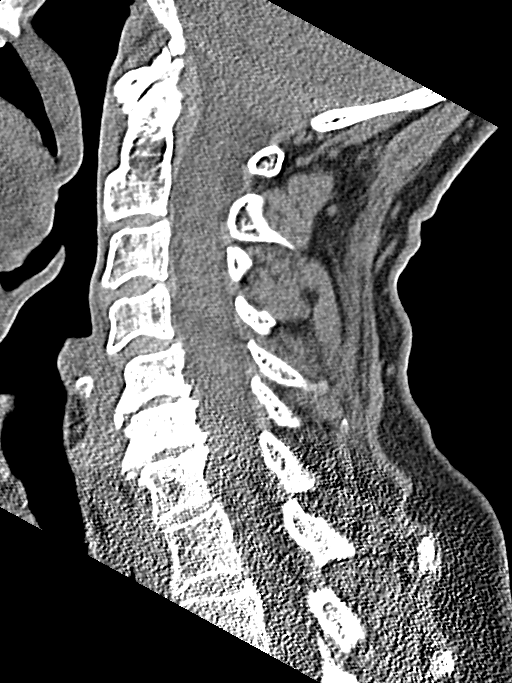
[im 36/71  bone]
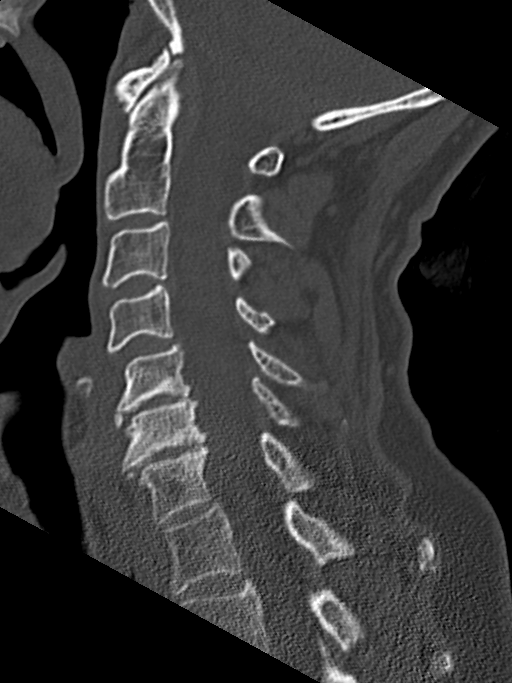
[im 41/71  bone]
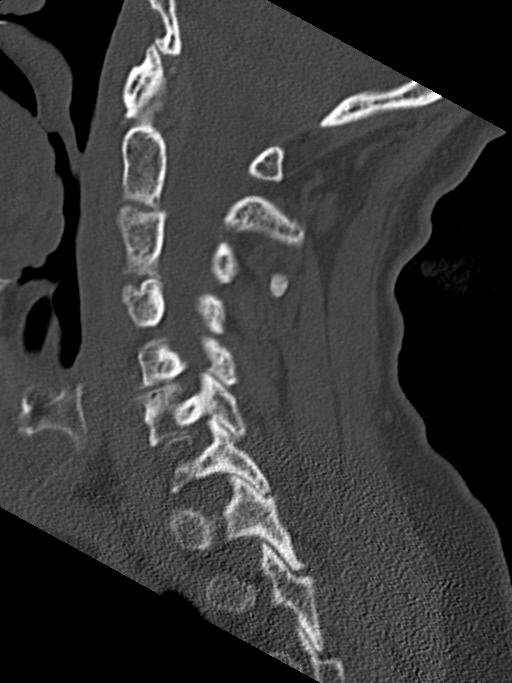
[im 47/71  bone]
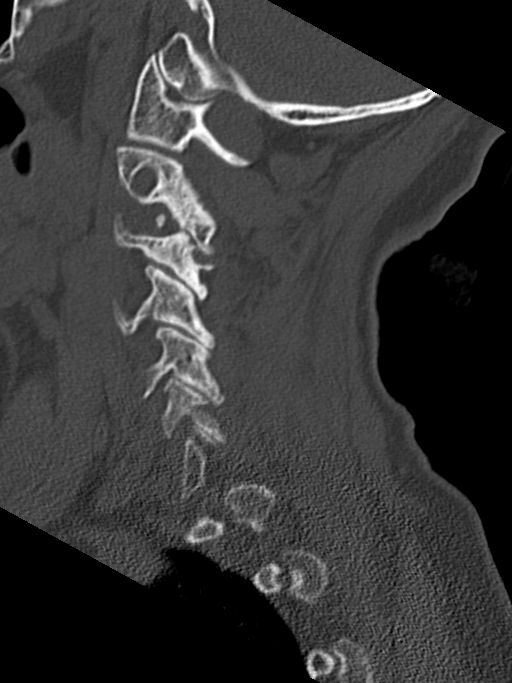

[Series 7: coronal bone · coronal · 0.28mm/px · 3 of 61 slices shown]
[im 16/61  bone]
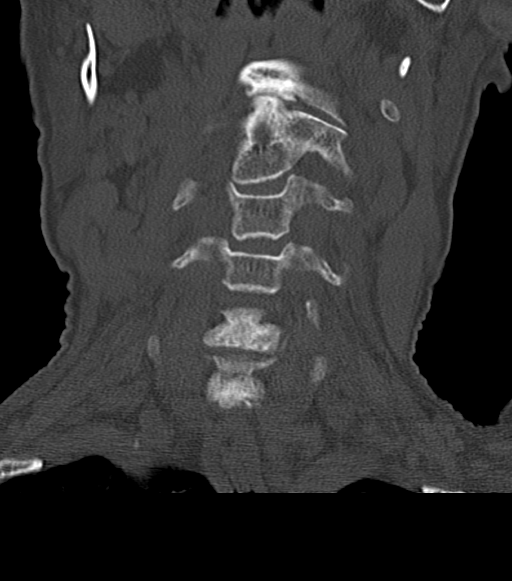
[im 26/61  bone]
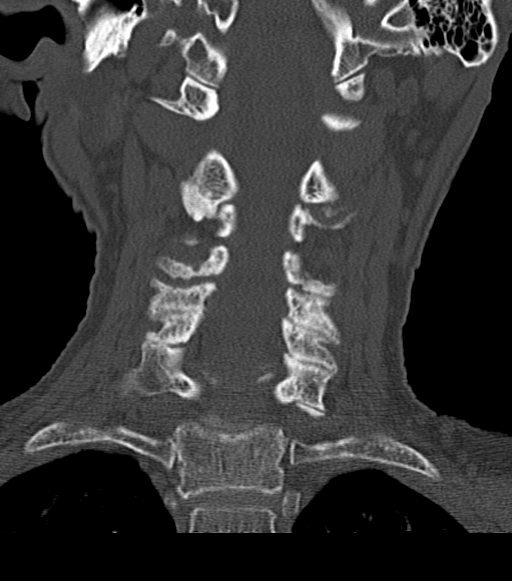
[im 36/61  bone]
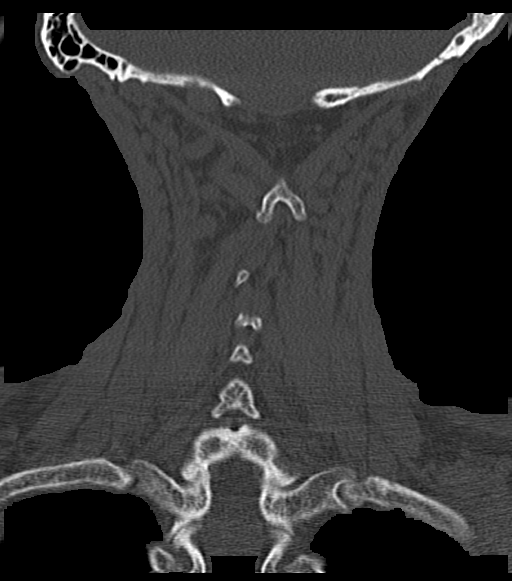

[Series 8: orthogonal bone · axial · 0.23mm/px · z∈[+778,+878]mm · 5 of 81 slices shown, 7 images]
[im 12/81  soft-tissue]
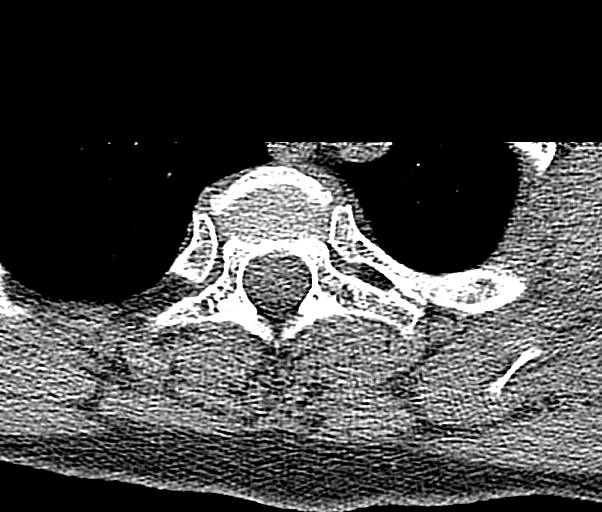
[im 12/81  bone]
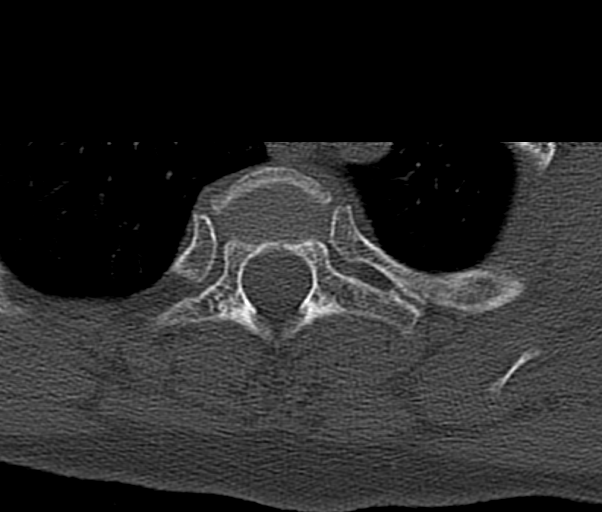
[im 23/81  bone]
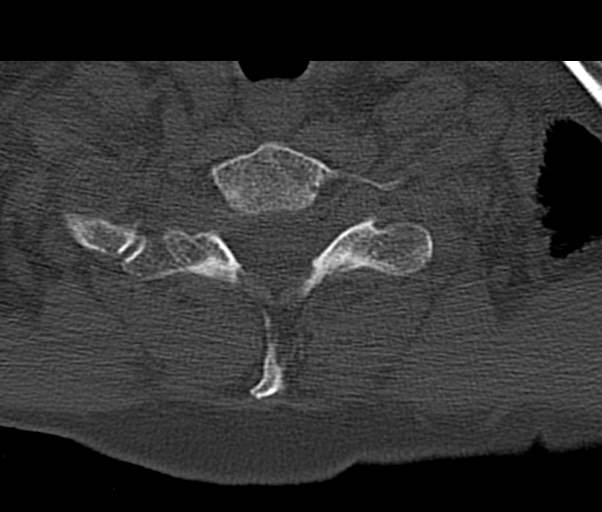
[im 46/81  bone]
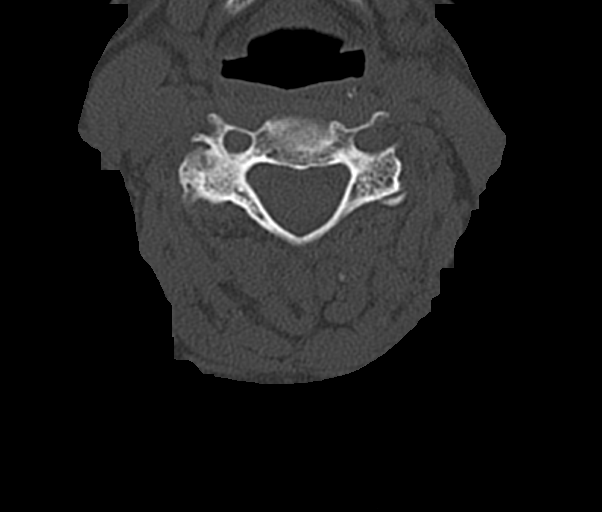
[im 58/81  bone]
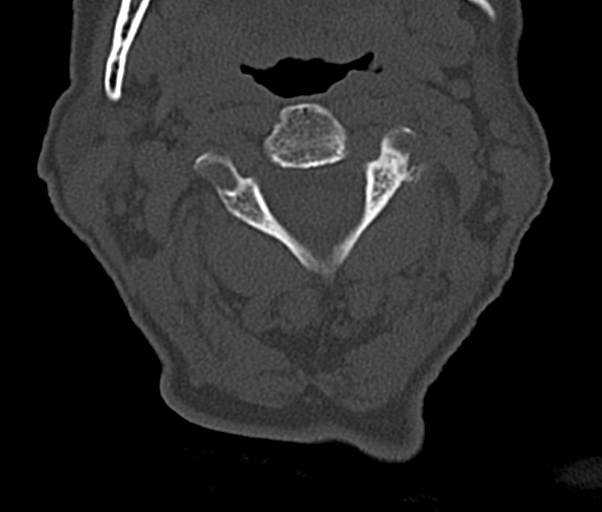
[im 69/81  soft-tissue]
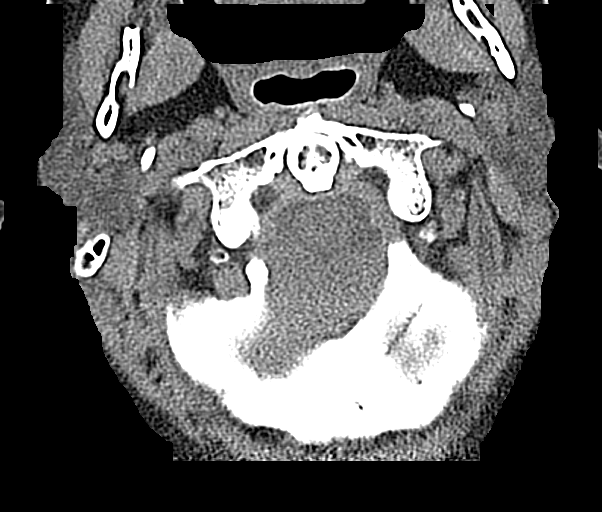
[im 69/81  bone]
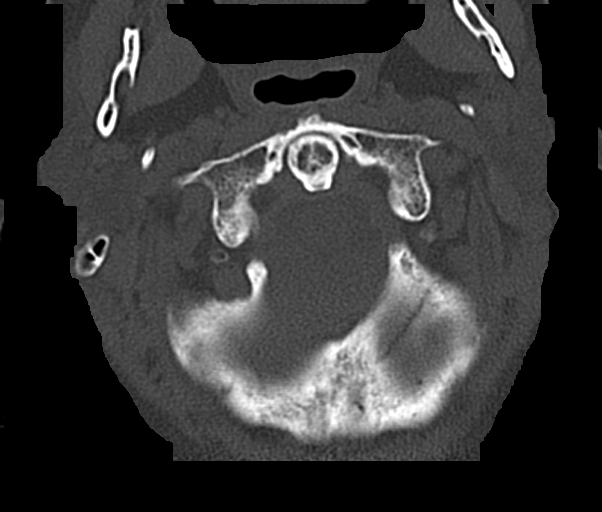

[13 of 33 positions shown; findings below may reference images not displayed]

FINDINGS: CT HEAD FINDINGS

Brain: No evidence of acute infarction, hemorrhage, hydrocephalus,
extra-axial collection or mass lesion/mass effect.

Vascular: No hyperdense vessel or unexpected calcification.

Skull: Normal. Negative for fracture or focal lesion.

Sinuses/Orbits: No acute finding.

Other: None.

CT CERVICAL SPINE FINDINGS

Alignment: Mild grade 1 anterolisthesis of C4-5 is noted secondary
to posterior facet joint hypertrophy.

Skull base and vertebrae: No acute fracture. No primary bone lesion
or focal pathologic process.

Soft tissues and spinal canal: No prevertebral fluid or swelling. No
visible canal hematoma.

Disc levels: Moderate degenerative disc disease is noted at C5-6 and
C6-7 with anterior posterior osteophyte formation.

Upper chest: Negative.

Other: None.
IMPRESSION: No acute intracranial abnormality seen.

Moderate multilevel degenerative disc disease. No acute abnormality
seen in the cervical spine.

## 2023-04-08 IMAGING — CR DG HUMERUS 2V *L*
1 series · 2 of 2 positions shown · non-contrast
Comparison: None.

CLINICAL DATA: 69-year-old female with fall and trauma to the left
upper extremity.

EXAM:
LEFT HUMERUS - 2+ VIEW; LEFT FOREARM - 2 VIEW; LEFT WRIST - COMPLETE
3+ VIEW

[Series 1: dg humerus left · 0.14mm/px · 2 of 2 slices shown]
[im 1/2]
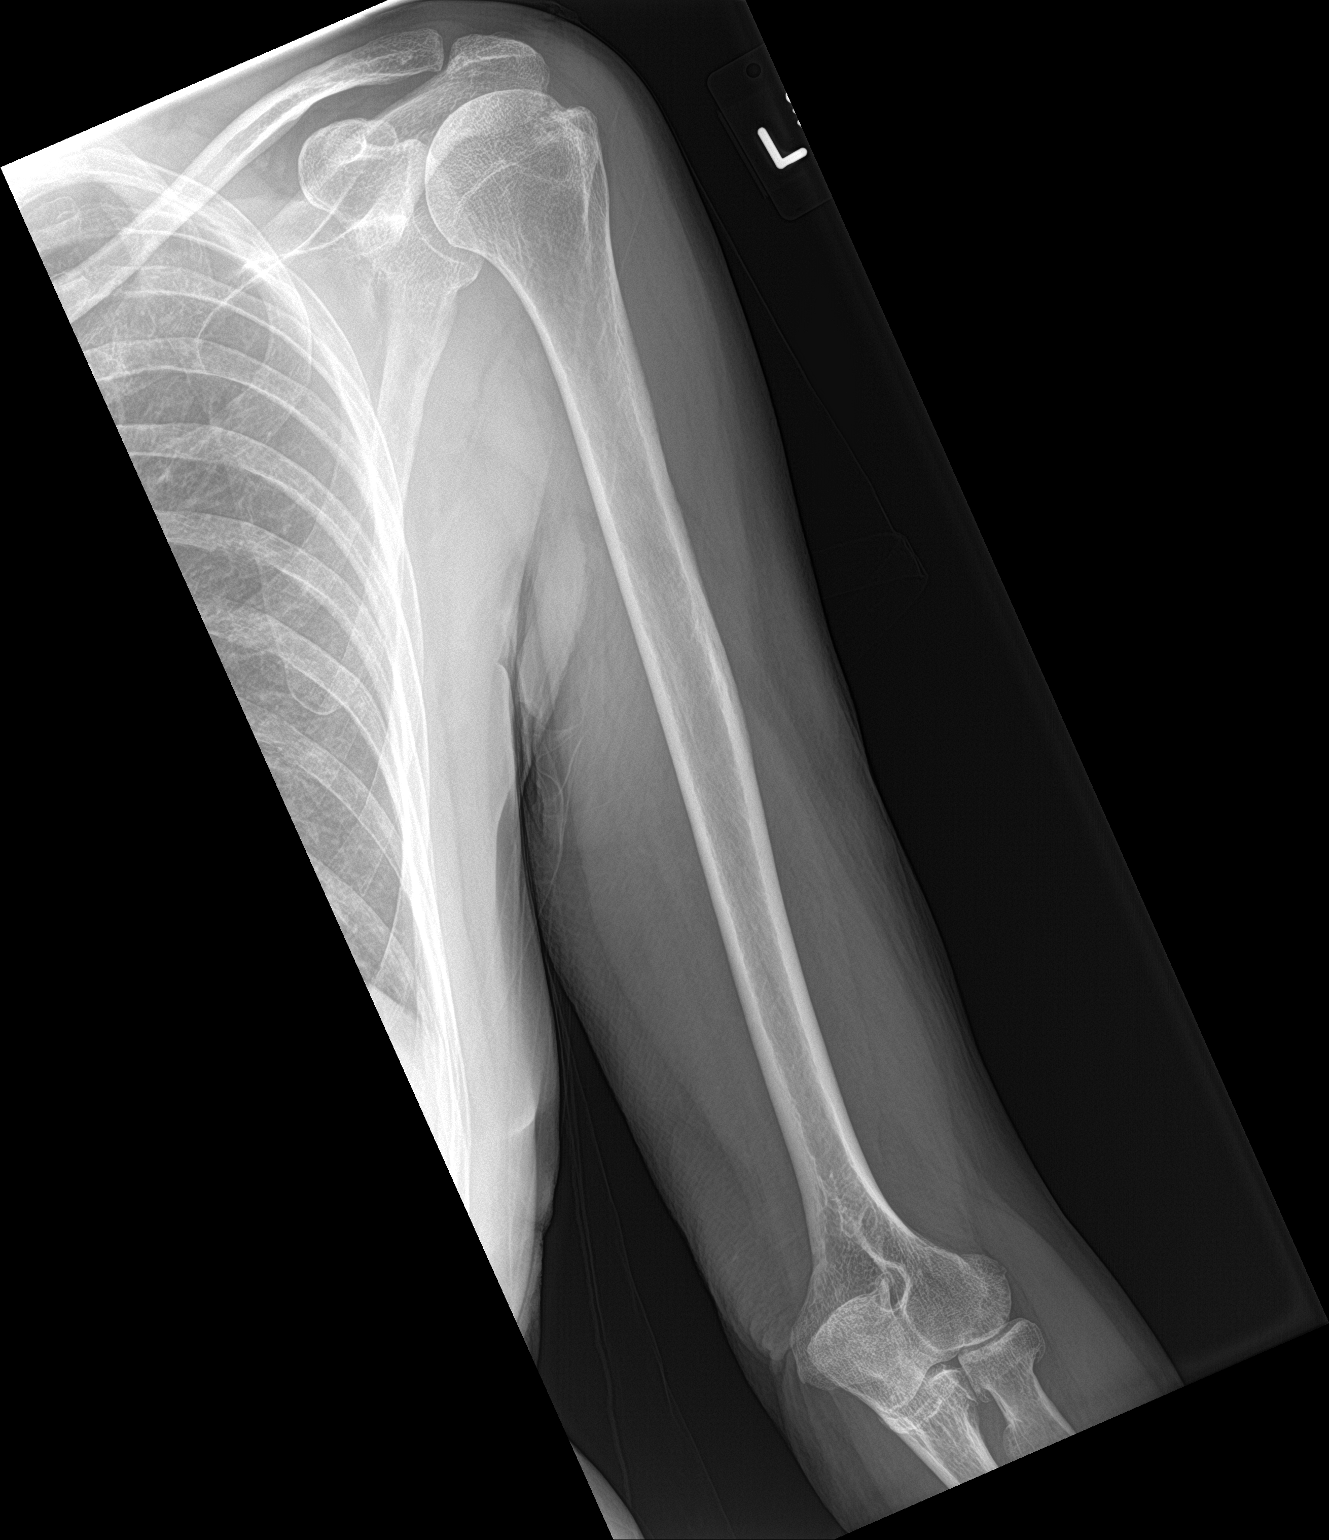
[im 2/2]
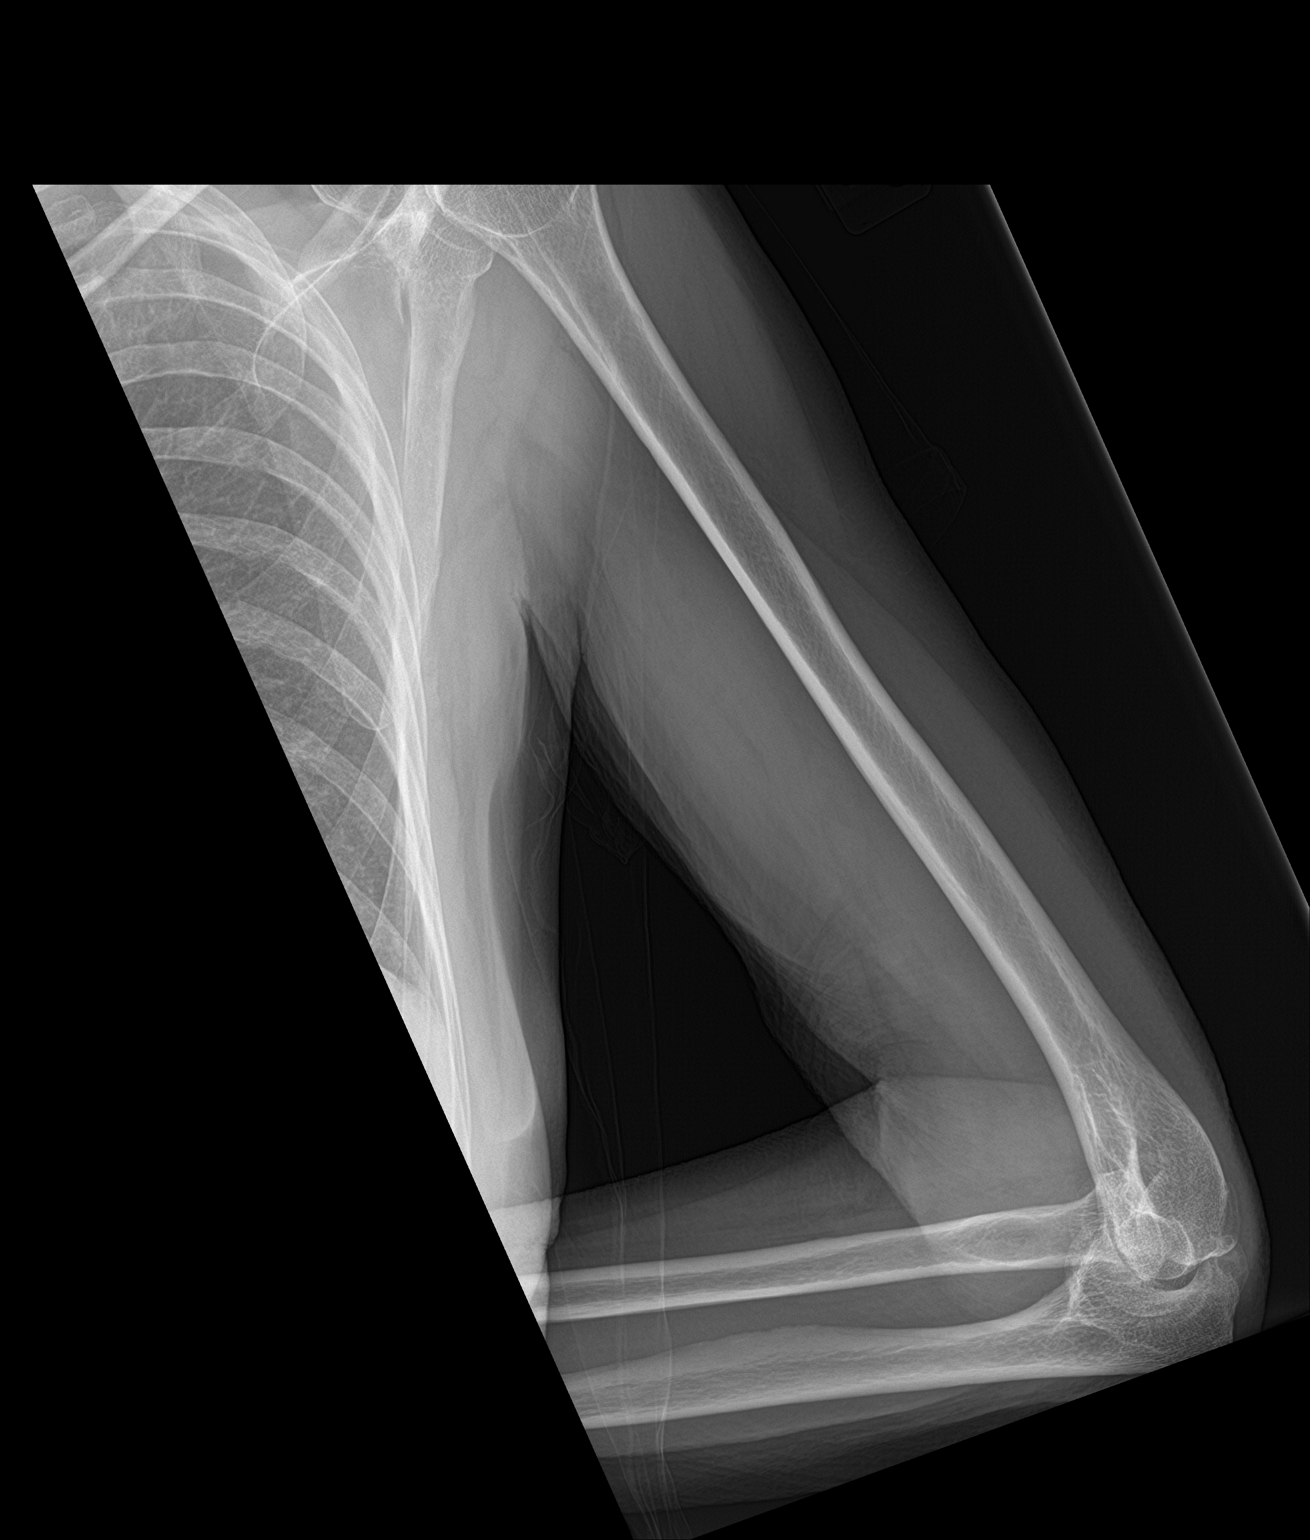

[2 of 2 positions shown; findings below may reference images not displayed]

FINDINGS: There is no acute fracture or dislocation. The bones are osteopenic.
Degenerative changes of the left elbow with spurring and probable
minimal effusion. There is also degenerative changes of the base of
the thumb. The soft tissues are unremarkable.
IMPRESSION: No acute fracture or dislocation.

## 2023-04-08 IMAGING — CT CT HEAD W/O CM
3 series · 15 of 47 positions shown, 18 images · non-contrast
Comparison: None.

CLINICAL DATA: Neck injury after fall yesterday.

EXAM:
CT HEAD WITHOUT CONTRAST
CT CERVICAL SPINE WITHOUT CONTRAST
TECHNIQUE: Multidetector CT imaging of the head and cervical spine was
performed following the standard protocol without intravenous
contrast. Multiplanar CT image reconstructions of the cervical spine
were also generated.

[Series 3: head wo · axial · 0.40mm/px · z∈[+934,+1059]mm · 9 of 30 slices shown, 12 images]
[im 3/30  brain]
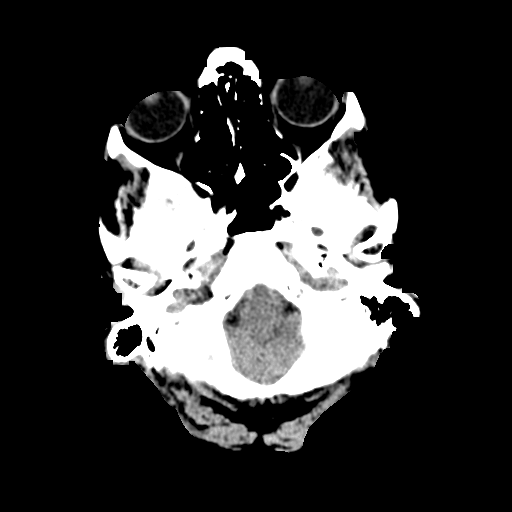
[im 3/30  bone]
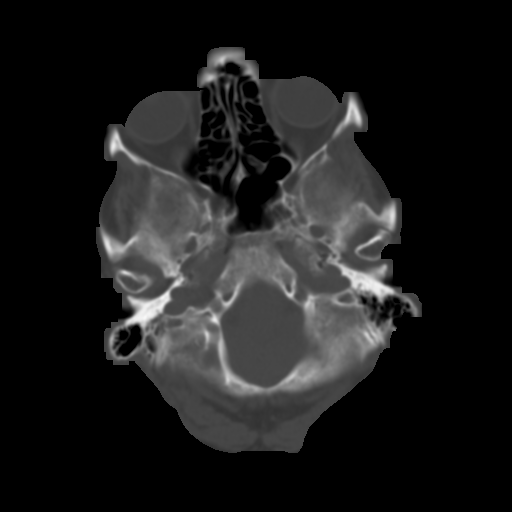
[im 6/30  brain]
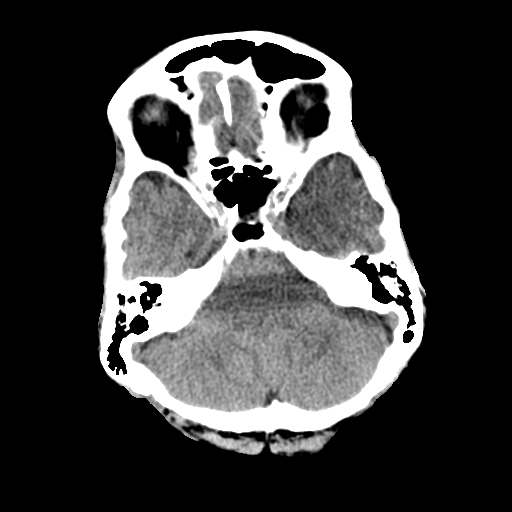
[im 9/30  brain]
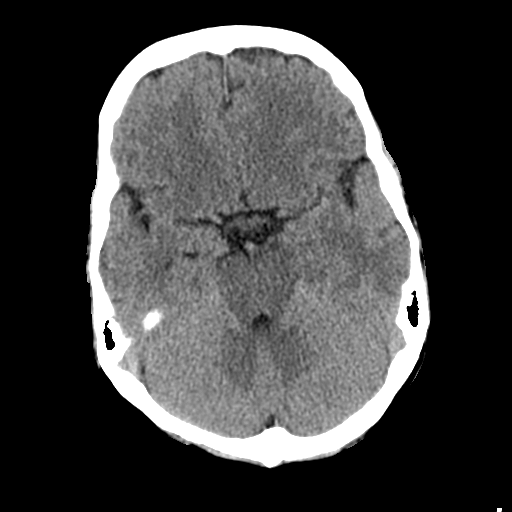
[im 12/30  brain]
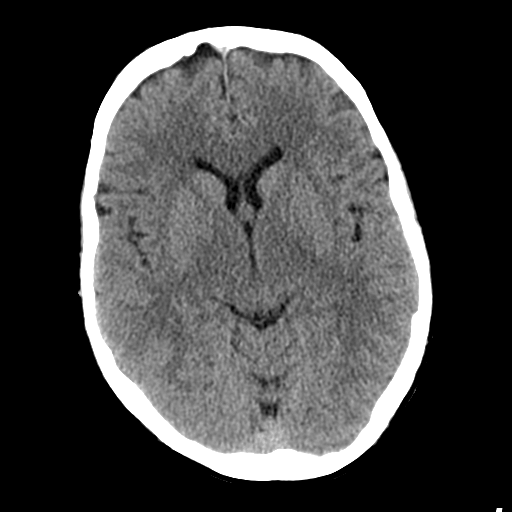
[im 16/30  brain]
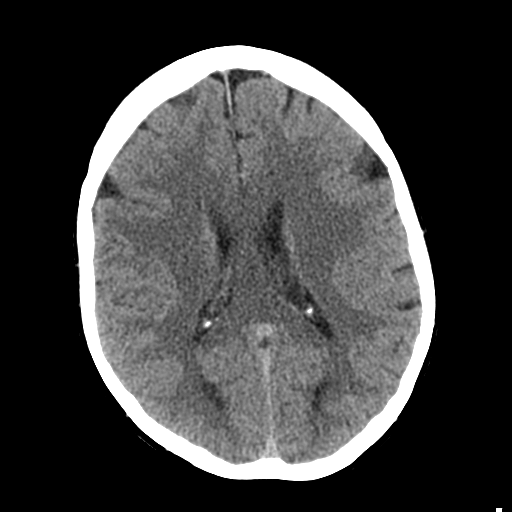
[im 16/30  bone]
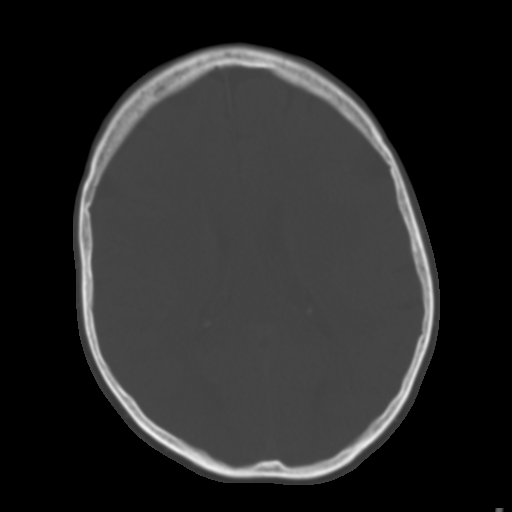
[im 19/30  brain]
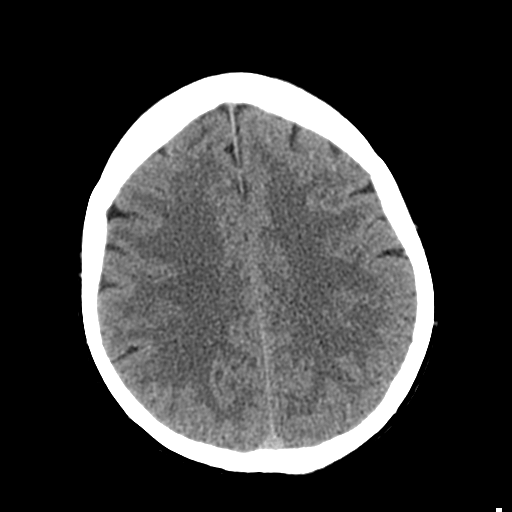
[im 22/30  brain]
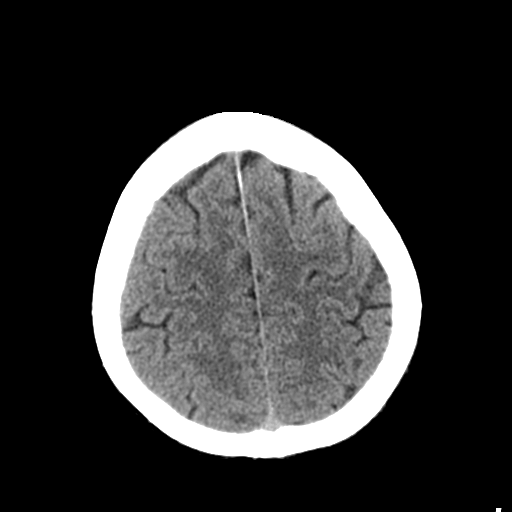
[im 25/30  brain]
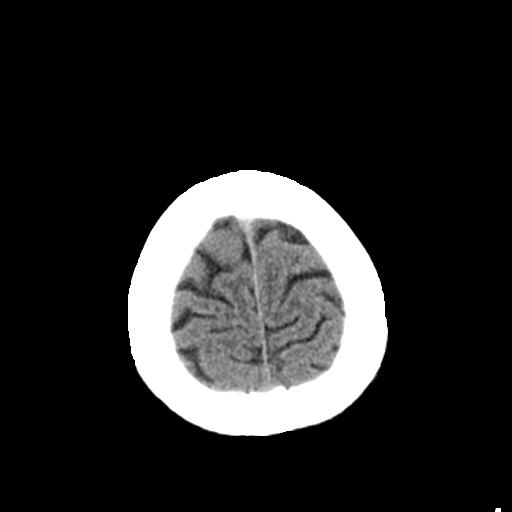
[im 28/30  brain]
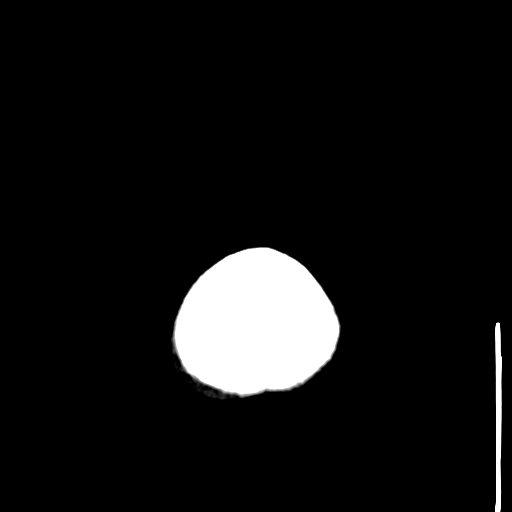
[im 28/30  bone]
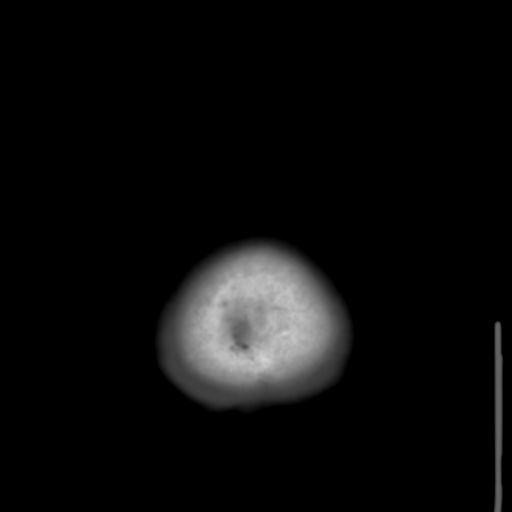

[Series 4: coronal soft tissue · coronal · 0.29mm/px · 3 of 65 slices shown]
[im 22/65  brain]
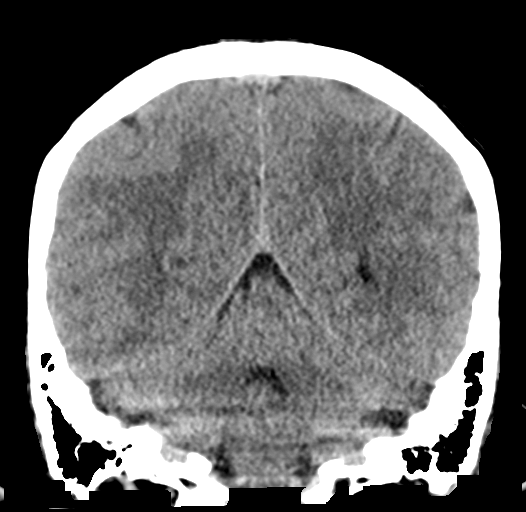
[im 29/65  brain]
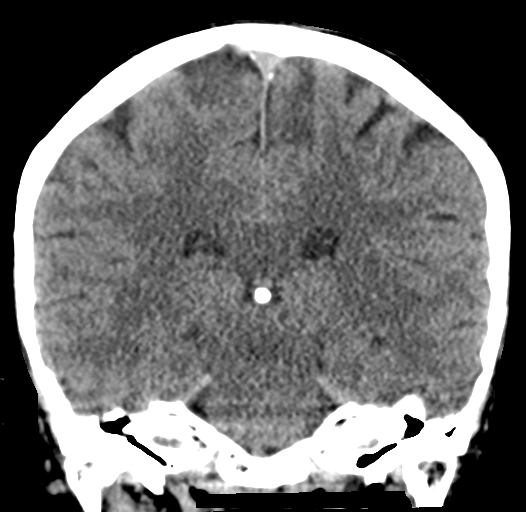
[im 36/65  brain]
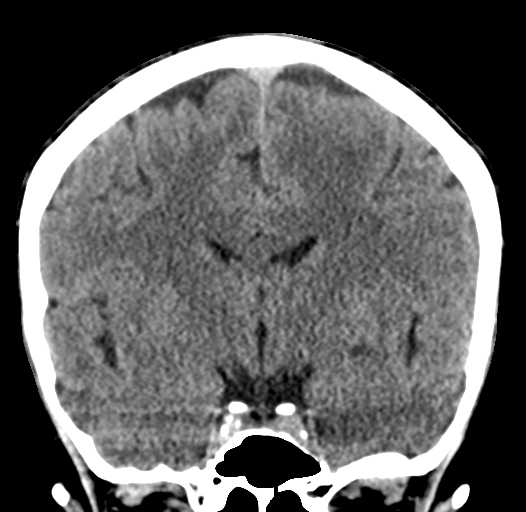

[Series 5: sagittal soft tissue · sagittal · 0.29mm/px · 3 of 51 slices shown]
[im 17/51  brain]
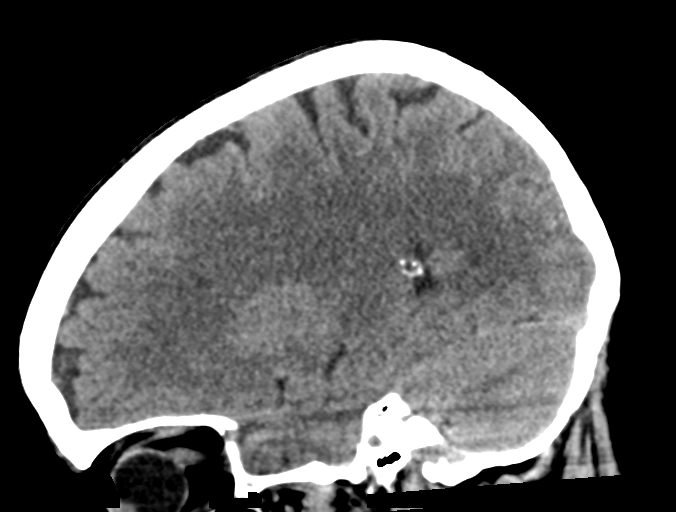
[im 26/51  brain]
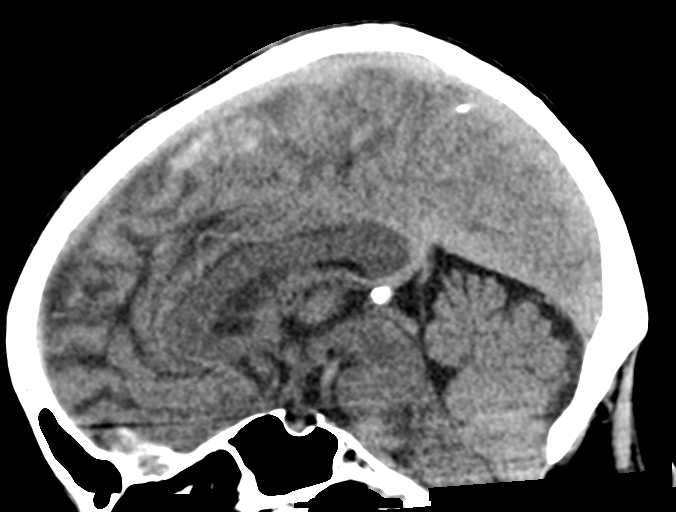
[im 34/51  brain]
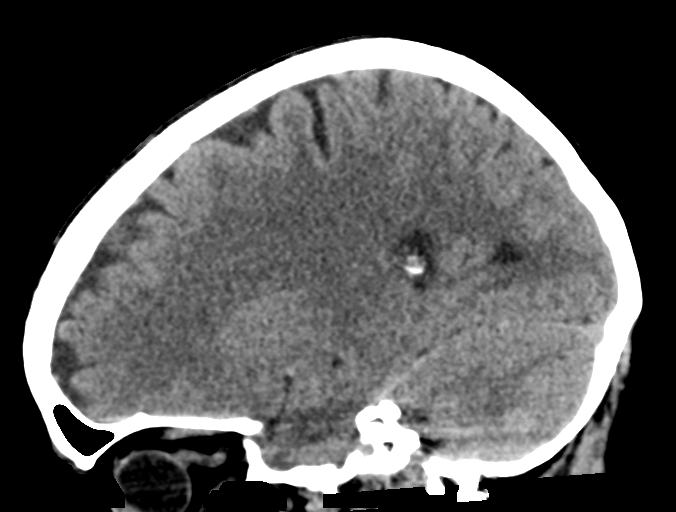

[15 of 47 positions shown; findings below may reference images not displayed]

FINDINGS: CT HEAD FINDINGS

Brain: No evidence of acute infarction, hemorrhage, hydrocephalus,
extra-axial collection or mass lesion/mass effect.

Vascular: No hyperdense vessel or unexpected calcification.

Skull: Normal. Negative for fracture or focal lesion.

Sinuses/Orbits: No acute finding.

Other: None.

CT CERVICAL SPINE FINDINGS

Alignment: Mild grade 1 anterolisthesis of C4-5 is noted secondary
to posterior facet joint hypertrophy.

Skull base and vertebrae: No acute fracture. No primary bone lesion
or focal pathologic process.

Soft tissues and spinal canal: No prevertebral fluid or swelling. No
visible canal hematoma.

Disc levels: Moderate degenerative disc disease is noted at C5-6 and
C6-7 with anterior posterior osteophyte formation.

Upper chest: Negative.

Other: None.
IMPRESSION: No acute intracranial abnormality seen.

Moderate multilevel degenerative disc disease. No acute abnormality
seen in the cervical spine.

## 2023-07-12 ENCOUNTER — Other Ambulatory Visit: Payer: Self-pay

## 2023-07-12 ENCOUNTER — Emergency Department
Admission: EM | Admit: 2023-07-12 | Discharge: 2023-07-13 | Discharge status: Against Medical Advice | Payer: Medicare Other | Attending: Emergency Medicine | Admitting: Emergency Medicine

## 2023-07-12 DIAGNOSIS — Z5321 Procedure and treatment not carried out due to patient leaving prior to being seen by health care provider: Secondary | ICD-10-CM | POA: Diagnosis not present

## 2023-07-12 DIAGNOSIS — R109 Unspecified abdominal pain: Secondary | ICD-10-CM | POA: Diagnosis present

## 2023-07-12 DIAGNOSIS — R1110 Vomiting, unspecified: Secondary | ICD-10-CM | POA: Insufficient documentation

## 2023-07-12 DIAGNOSIS — R1013 Epigastric pain: Secondary | ICD-10-CM | POA: Diagnosis not present

## 2023-07-12 LAB — COMPREHENSIVE METABOLIC PANEL
ALT: 28 U/L (ref 0–44)
AST: 31 U/L (ref 15–41)
Albumin: 4.5 g/dL (ref 3.5–5.0)
Alkaline Phosphatase: 60 U/L (ref 38–126)
Anion gap: 9 (ref 5–15)
BUN: 14 mg/dL (ref 8–23)
CO2: 22 mmol/L (ref 22–32)
Calcium: 9.3 mg/dL (ref 8.9–10.3)
Chloride: 106 mmol/L (ref 98–111)
Creatinine, Ser: 2.21 mg/dL — ABNORMAL HIGH (ref 0.44–1.00)
GFR, Estimated: 23 mL/min — ABNORMAL LOW (ref >60)
Glucose, Bld: 83 mg/dL (ref 70–99)
Potassium: 4.1 mmol/L (ref 3.5–5.1)
Sodium: 137 mmol/L (ref 135–145)
Total Bilirubin: 0.6 mg/dL (ref 0.3–1.2)
Total Protein: 6.9 g/dL (ref 6.5–8.1)

## 2023-07-12 LAB — CBC
HCT: 38 % (ref 36.0–46.0)
Hemoglobin: 12.8 g/dL (ref 12.0–15.0)
MCH: 31.5 pg (ref 26.0–34.0)
MCHC: 33.7 g/dL (ref 30.0–36.0)
MCV: 93.6 fL (ref 80.0–100.0)
Platelets: 154 10*3/uL (ref 150–400)
RBC: 4.06 MIL/uL (ref 3.87–5.11)
RDW: 13.2 % (ref 11.5–15.5)
WBC: 8.8 10*3/uL (ref 4.0–10.5)
nRBC: 0 % (ref 0.0–0.2)

## 2023-07-12 LAB — URINALYSIS, ROUTINE W REFLEX MICROSCOPIC
Bilirubin Urine: NEGATIVE
Glucose, UA: NEGATIVE mg/dL
Ketones, ur: NEGATIVE mg/dL
Leukocytes,Ua: NEGATIVE
Nitrite: NEGATIVE
Protein, ur: 100 mg/dL — AB
Specific Gravity, Urine: 1.011 (ref 1.005–1.030)
pH: 5 (ref 5.0–8.0)

## 2023-07-12 LAB — TROPONIN I (HIGH SENSITIVITY): Troponin I (High Sensitivity): 5 ng/L (ref <18)

## 2023-07-12 LAB — LIPASE, BLOOD: Lipase: 47 U/L (ref 11–51)

## 2023-07-12 NOTE — ED Triage Notes (Signed)
Pt to ED via POV c/o vomiting and abd pain. Pt reports that for about a month and half she has been throwing up almost every other day. Pt reports bile in vomit. Pt also reporting epigastric pain. Denies CP, SOB , fevers, dizziness

## 2023-07-12 NOTE — ED Notes (Signed)
No answer when called several times from lobby 

## 2023-07-13 NOTE — ED Notes (Signed)
No answer when called several times from lobby 

## 2024-06-24 ENCOUNTER — Encounter: Payer: Self-pay | Admitting: Ophthalmology

## 2024-07-04 NOTE — Discharge Instructions (Signed)

## 2024-07-07 ENCOUNTER — Ambulatory Visit: Payer: Self-pay | Admitting: Anesthesiology

## 2024-07-07 ENCOUNTER — Encounter: Payer: Self-pay | Admitting: Ophthalmology

## 2024-07-07 ENCOUNTER — Ambulatory Visit
Admission: RE | Admit: 2024-07-07 | Discharge: 2024-07-07 | Disposition: A | Discharge status: Home / Self Care | Attending: Ophthalmology | Admitting: Ophthalmology

## 2024-07-07 ENCOUNTER — Encounter
Admission: RE | Disposition: A | Discharge status: Home / Self Care | Payer: Self-pay | Source: Home / Self Care | Attending: Ophthalmology

## 2024-07-07 ENCOUNTER — Other Ambulatory Visit: Payer: Self-pay

## 2024-07-07 DIAGNOSIS — F1290 Cannabis use, unspecified, uncomplicated: Secondary | ICD-10-CM | POA: Diagnosis not present

## 2024-07-07 DIAGNOSIS — Z87891 Personal history of nicotine dependence: Secondary | ICD-10-CM | POA: Insufficient documentation

## 2024-07-07 DIAGNOSIS — H2511 Age-related nuclear cataract, right eye: Secondary | ICD-10-CM | POA: Insufficient documentation

## 2024-07-07 HISTORY — PX: CATARACT EXTRACTION W/PHACO: SHX586

## 2024-07-07 SURGERY — PHACOEMULSIFICATION, CATARACT, WITH IOL INSERTION
Anesthesia: Monitor Anesthesia Care | Site: Eye | Laterality: Right

## 2024-07-07 MED ORDER — MOXIFLOXACIN HCL 0.5 % OP SOLN
OPHTHALMIC | Status: DC | PRN
  Administered 2024-07-07: .2 mL via OPHTHALMIC

## 2024-07-07 MED ORDER — TETRACAINE HCL 0.5 % OP SOLN
1.0000 [drp] | OPHTHALMIC | Status: DC | PRN
  Administered 2024-07-07 (×3): 1 [drp] via OPHTHALMIC
  Filled 2024-07-07: qty 2

## 2024-07-07 MED ORDER — SIGHTPATH DOSE#1 BSS IO SOLN
INTRAOCULAR | Status: DC | PRN
  Administered 2024-07-07: 15 mL via INTRAOCULAR

## 2024-07-07 MED ORDER — FENTANYL CITRATE (PF) 100 MCG/2ML IJ SOLN
INTRAMUSCULAR | Status: DC | PRN
  Administered 2024-07-07 (×2): 50 ug via INTRAVENOUS

## 2024-07-07 MED ORDER — MIDAZOLAM HCL 2 MG/2ML IJ SOLN
INTRAMUSCULAR | Status: AC
  Filled 2024-07-07: qty 2

## 2024-07-07 MED ORDER — FENTANYL CITRATE (PF) 100 MCG/2ML IJ SOLN
INTRAMUSCULAR | Status: AC
  Filled 2024-07-07: qty 2

## 2024-07-07 MED ORDER — ARMC OPHTHALMIC DILATING DROPS
1.0000 | OPHTHALMIC | Status: DC | PRN
  Administered 2024-07-07 (×3): 1 via OPHTHALMIC
  Filled 2024-07-07: qty 0.5

## 2024-07-07 MED ORDER — SIGHTPATH DOSE#1 NA HYALUR & NA CHOND-NA HYALUR IO KIT
PACK | INTRAOCULAR | Status: DC | PRN
  Administered 2024-07-07: 1 via OPHTHALMIC

## 2024-07-07 MED ORDER — EPINEPHRINE PF 1 MG/ML IJ SOLN: INTRAMUSCULAR | Status: DC | PRN

## 2024-07-07 MED ORDER — LACTATED RINGERS IV SOLN: INTRAVENOUS | Status: DC

## 2024-07-07 MED ORDER — LIDOCAINE HCL (PF) 2 % IJ SOLN
INTRAOCULAR | Status: DC | PRN
  Administered 2024-07-07: 4 mL via INTRAOCULAR

## 2024-07-07 MED ORDER — BRIMONIDINE TARTRATE-TIMOLOL 0.2-0.5 % OP SOLN
OPHTHALMIC | Status: DC | PRN
  Administered 2024-07-07: 1 [drp] via OPHTHALMIC

## 2024-07-07 MED ORDER — MIDAZOLAM HCL 2 MG/2ML IJ SOLN
INTRAMUSCULAR | Status: DC | PRN
  Administered 2024-07-07 (×2): 1 mg via INTRAVENOUS

## 2024-07-07 SURGICAL SUPPLY — 20 items
BNDG EYE OVAL 2 1/8 X 2 5/8 (GAUZE/BANDAGES/DRESSINGS) IMPLANT
CANNULA ANT/CHMB 27G (MISCELLANEOUS) IMPLANT
CATARACT SUITE SIGHTPATH (MISCELLANEOUS) ×1 IMPLANT
DISSECTOR HYDRO NUCLEUS 50X22 (MISCELLANEOUS) ×1 IMPLANT
DRSG TEGADERM 2-3/8X2-3/4 SM (GAUZE/BANDAGES/DRESSINGS) ×1 IMPLANT
FEE CATARACT SUITE SIGHTPATH (MISCELLANEOUS) ×1 IMPLANT
GLOVE BIOGEL PI IND STRL 8 (GLOVE) ×1 IMPLANT
GLOVE PI ULTRA LF STRL 7.5 (GLOVE) IMPLANT
GLOVE SURG LX STRL 7.5 STRW (GLOVE) ×1 IMPLANT
GLOVE SURG SYN 6.5 PF PI BL (GLOVE) ×1 IMPLANT
KNIFE SIDECUT EYE (MISCELLANEOUS) IMPLANT
LENS IOL ENVISTA ASPR 20.5 (Intraocular Lens) IMPLANT
NDL FILTER BLUNT 18X1 1/2 (NEEDLE) ×1 IMPLANT
NEEDLE FILTER BLUNT 18X1 1/2 (NEEDLE) ×1 IMPLANT
PACK VIT ANT 23G (MISCELLANEOUS) IMPLANT
RING MALYGIN (MISCELLANEOUS) IMPLANT
RING MALYGIN 7.0 (MISCELLANEOUS) IMPLANT
SUTURE EHLN 10-0 CS-B-6CS-B-6 (SUTURE) IMPLANT
SYR 3ML LL SCALE MARK (SYRINGE) ×1 IMPLANT
SYR 5ML LL (SYRINGE) IMPLANT

## 2024-07-07 NOTE — Anesthesia Preprocedure Evaluation (Addendum)
 Anesthesia Evaluation  Patient identified by MRN, date of birth, ID band Patient awake    Reviewed: Allergy & Precautions, H&P , NPO status , Patient's Chart, lab work & pertinent test results  Airway Mallampati: IV  TM Distance: <3 FB Neck ROM: Full    Dental no notable dental hx. (+) Partial Upper   Pulmonary neg pulmonary ROS, former smoker   Pulmonary exam normal breath sounds clear to auscultation       Cardiovascular negative cardio ROS Normal cardiovascular exam Rhythm:Regular Rate:Normal     Neuro/Psych  PSYCHIATRIC DISORDERS Anxiety     negative neurological ROS  negative psych ROS   GI/Hepatic negative GI ROS, Neg liver ROS,,,  Endo/Other  negative endocrine ROS    Renal/GU negative Renal ROS  negative genitourinary   Musculoskeletal negative musculoskeletal ROS (+) Arthritis ,    Abdominal   Peds negative pediatric ROS (+)  Hematology negative hematology ROS (+)   Anesthesia Other Findings Arthritis cancer  Reproductive/Obstetrics negative OB ROS                              Anesthesia Physical Anesthesia Plan  ASA: 2  Anesthesia Plan: MAC   Post-op Pain Management:    Induction: Intravenous  PONV Risk Score and Plan:   Airway Management Planned: Natural Airway and Nasal Cannula  Additional Equipment:   Intra-op Plan:   Post-operative Plan:   Informed Consent: I have reviewed the patients History and Physical, chart, labs and discussed the procedure including the risks, benefits and alternatives for the proposed anesthesia with the patient or authorized representative who has indicated his/her understanding and acceptance.     Dental Advisory Given  Plan Discussed with: Anesthesiologist, CRNA and Surgeon  Anesthesia Plan Comments: (Patient consented for risks of anesthesia including but not limited to:  - adverse reactions to medications - damage to  eyes, teeth, lips or other oral mucosa - nerve damage due to positioning  - sore throat or hoarseness - Damage to heart, brain, nerves, lungs, other parts of body or loss of life  Patient voiced understanding and assent.)         Anesthesia Quick Evaluation

## 2024-07-07 NOTE — Op Note (Signed)
 OPERATIVE NOTE  Kelly Sanchez [id number] 07/07/2024   PREOPERATIVE DIAGNOSIS: Nuclear sclerotic cataract right eye. H25.11   POSTOPERATIVE DIAGNOSIS: Nuclear sclerotic cataract right eye. H25.11   PROCEDURE:  Phacoemusification with posterior chamber intraocular lens placement of the right eye  Ultrasound time: Procedure(s): PHACOEMULSIFICATION, CATARACT, WITH IOL INSERTION 18.02 01:28.7 (Right)  LENS:   Implant Name Type Inv. Item Serial No. Manufacturer Lot No. LRB No. Used Action  enVista Hydrophobic Acrylic IOL Intraocular Lens  [id number] [employer] [id number] Right 1 Implanted      SURGEON:  Feliciano HERO. [name], MD   ANESTHESIA:  Topical with tetracaine  drops, augmented with 1% preservative-free intracameral lidocaine .   COMPLICATIONS:  None.   DESCRIPTION OF PROCEDURE:  The patient was identified in the holding room and transported to the operating room and placed in the supine position under the operating microscope.  The right eye was identified as the operative eye, which was prepped and draped in the usual sterile ophthalmic fashion.   A 1 millimeter clear-corneal paracentesis was made superotemporally. Preservative-free 1% lidocaine  mixed with 1:1,000 bisulfite-free aqueous solution of epinephrine was injected into the anterior chamber. The anterior chamber was then filled with Viscoat viscoelastic. A 2.4 millimeter keratome was used to make a clear-corneal incision inferotemporally. A curvilinear capsulorrhexis was made with a cystotome and capsulorrhexis forceps. Balanced salt solution was used to hydrodissect and hydrodelineate the nucleus. Phacoemulsification was then used to remove the lens nucleus and epinucleus. The remaining cortex was then removed using the irrigation and aspiration handpiece. Provisc was then placed into the capsular bag to distend it for lens placement. A +20.50 D EA intraocular lens was then injected into the capsular bag. The remaining  viscoelastic was aspirated.   Wounds were hydrated with balanced salt solution.  The anterior chamber was inflated to a physiologic pressure with balanced salt solution.  No wound leaks were noted. Moxifloxacin  was injected intracamerally.  Timolol  and Brimonidine  drops were applied to the eye.  The patient was taken to the recovery room in stable condition without complications of anesthesia or surgery.  Feliciano Hugger [place] 07/07/2024, 8:34 AM

## 2024-07-07 NOTE — H&P (Signed)
 [facility]   Primary Care Physician:  Missy Mallie MATSU, MD Ophthalmologist: Dr. Feliciano Ober  Pre-Procedure History & Physical: HPI:  Kelly Sanchez is a 73 y.o. female here for cataract surgery.   Past Medical History:  Diagnosis Date   Arthritis    Cancer [facility])     Past Surgical History:  Procedure Laterality Date   APPENDECTOMY     CARDIAC CATHETERIZATION     CHOLECYSTECTOMY     TOTAL KNEE ARTHROPLASTY Left     Prior to Admission medications   Medication Sig Start Date End Date Taking? Authorizing Provider  aspirin EC 81 MG tablet Take 81 mg by mouth daily. Swallow whole.   Yes [provider]  metoprolol succinate (TOPROL-XL) 25 MG 24 hr tablet Take 25 mg by mouth every morning. 12/04/21  Yes [provider]  omeprazole  (PRILOSEC) 40 MG capsule Take 1 capsule (40 mg total) by mouth daily before breakfast. 09/19/21  Yes Karamalegos, Marsa PARAS, DO  oxyCODONE -acetaminophen  (PERCOCET/ROXICET) 5-325 MG tablet Take 1 tablet by mouth in the morning, at noon, and at bedtime.   Yes [provider]  rosuvastatin  (CRESTOR ) 20 MG tablet Take 1 tablet (20 mg total) by mouth every evening. 04/07/22  Yes Karamalegos, Marsa PARAS, DO  gabapentin (NEURONTIN) 300 MG capsule Take by mouth. 09/04/18 12/09/19  [provider]    Allergies as of 04/22/2024   (No Known Allergies)    Family History  Problem Relation Age of Onset   Dementia Mother    Heart attack Father     Social History   Socioeconomic History   Marital status: Widowed    Spouse name: Not on file   Number of children: Not on file   Years of education: Not on file   Highest education level: Not on file  Occupational History   Not on file  Tobacco Use   Smoking status: Former    Current packs/day: 0.50    Types: Cigarettes   Smokeless tobacco: Never  Vaping Use   Vaping status: Never Used  Substance and Sexual Activity   Alcohol use: No   Drug use: Yes    Types:  Marijuana   Sexual activity: Never    Birth control/protection: Post-menopausal  Other Topics Concern   Not on file  Social History Narrative   Not on file   Social Drivers of Health   Financial Resource Strain: Low Risk  (08/18/2020)   Received from [facility]   Overall Financial Resource Strain (CARDIA)    Difficulty of Paying Living Expenses: Not hard at all  Food Insecurity: Food Insecurity Present (06/11/2024)   Received from [facility]   Hunger Vital Sign    Within the past 12 months, you worried that your food would run out before you got the money to buy more.: Never true    Within the past 12 months, the food you bought just didn't last and you didn't have money to get more.: Sometimes true  Transportation Needs: No Transportation Needs (06/11/2024)   Received from [facility]   PRAPARE - Transportation    Lack of Transportation (Medical): No    Lack of Transportation (Non-Medical): No  Physical Activity: Not on file  Stress: Not on file  Social Connections: Unknown (02/20/2023)   Received from [facility]   Social Network    Social Network: Not on file  Intimate Partner Violence: Not At Risk (03/05/2023)   Received from [facility] and  [facility]   Humiliation, Afraid, Rape, and Kick questionnaire    Within the last year, have you been afraid of your partner or ex-partner?: No    Within the last year, have you been humiliated or emotionally abused in other ways by your partner or ex-partner?: No    Within the last year, have you been kicked, hit, slapped, or otherwise physically hurt by your partner or ex-partner?: No    Within the last year, have you been raped or forced to have any kind of sexual activity by your partner or ex-partner?: No    Review of Systems: See HPI, otherwise negative ROS  Physical Exam: BP (!) 148/74   Pulse (!) 44   Temp (!) 97.3 F (36.3 C) (Temporal)   Resp 10   Ht 5' 2.01 (1.575 m)   Wt 57.8 kg   SpO2 100%    BMI 23.30 kg/m  General:   Alert, cooperative in NAD Head:  Normocephalic and atraumatic. Respiratory:  Normal work of breathing. Cardiovascular:  RRR  Impression/Plan: Kelly Sanchez is here for cataract surgery.  Risks, benefits, limitations, and alternatives regarding cataract surgery have been reviewed with the patient.  Questions have been answered.  All parties agreeable.   Feliciano Bryan Ober, MD  07/07/2024, 7:22 AM

## 2024-07-07 NOTE — Transfer of Care (Signed)
 Immediate Anesthesia Transfer of Care Note  Patient: Kelly Sanchez  Procedure(s) Performed: PHACOEMULSIFICATION, CATARACT, WITH IOL INSERTION 18.02 01:28.7 (Right: Eye)  Patient Location: PACU  Anesthesia Type:MAC  Level of Consciousness: awake and alert   Airway & Oxygen Therapy: Patient Spontanous Breathing  Post-op Assessment: Report given to RN and Post -op Vital signs reviewed and stable  Post vital signs: Reviewed and stable  Last Vitals:  Vitals Value Taken Time  BP    Temp    Pulse    Resp    SpO2      Last Pain:  Vitals:   07/07/24 0714  TempSrc: Temporal  PainSc: 0-No pain         Complications: No notable events documented.

## 2024-07-07 NOTE — Anesthesia Postprocedure Evaluation (Signed)
 Anesthesia Post Note  Patient: Kelly Sanchez  Procedure(s) Performed: PHACOEMULSIFICATION, CATARACT, WITH IOL INSERTION 18.02 01:28.7 (Right: Eye)  Patient location during evaluation: PACU Anesthesia Type: MAC Level of consciousness: awake and alert Pain management: pain level controlled Vital Signs Assessment: post-procedure vital signs reviewed and stable Respiratory status: spontaneous breathing, nonlabored ventilation, respiratory function stable and patient connected to nasal cannula oxygen Cardiovascular status: stable and blood pressure returned to baseline Postop Assessment: no apparent nausea or vomiting Anesthetic complications: no   No notable events documented.   Last Vitals:  Vitals:   07/07/24 0839 07/07/24 0843  BP: (!) 141/65   Pulse: (!) 59 (!) 40  Resp: (!) 21 16  Temp:  37.1 C  SpO2: 97% 97%    Last Pain:  Vitals:   07/07/24 0843  TempSrc:   PainSc: 0-No pain                 Donny JAYSON Mu

## 2024-07-08 ENCOUNTER — Encounter: Payer: Self-pay | Admitting: Ophthalmology

## 2024-07-08 NOTE — Anesthesia Preprocedure Evaluation (Addendum)
 Anesthesia Evaluation    Airway Mallampati: IV  TM Distance: <3 FB Neck ROM: Full    Dental no notable dental hx. (+) Partial Upper   Pulmonary former smoker   Pulmonary exam normal breath sounds clear to auscultation       Cardiovascular Normal cardiovascular exam Rhythm:Regular Rate:Normal     Neuro/Psych  PSYCHIATRIC DISORDERS Anxiety        GI/Hepatic   Endo/Other    Renal/GU      Musculoskeletal  (+) Arthritis ,    Abdominal   Peds  Hematology   Anesthesia Other Findings Previous cataract surgery 07-07-24 Dr. Ola at [facility]  Arthritis cancer  Reproductive/Obstetrics                              Anesthesia Physical Anesthesia Plan  ASA: 2  Anesthesia Plan: MAC   Post-op Pain Management:    Induction: Intravenous  PONV Risk Score and Plan:   Airway Management Planned: Natural Airway and Nasal Cannula  Additional Equipment:   Intra-op Plan:   Post-operative Plan:   Informed Consent: I have reviewed the patients History and Physical, chart, labs and discussed the procedure including the risks, benefits and alternatives for the proposed anesthesia with the patient or authorized representative who has indicated his/her understanding and acceptance.     Dental Advisory Given  Plan Discussed with: Anesthesiologist, CRNA and Surgeon  Anesthesia Plan Comments: (Patient consented for risks of anesthesia including but not limited to:  - adverse reactions to medications - damage to eyes, teeth, lips or other oral mucosa - nerve damage due to positioning  - sore throat or hoarseness - Damage to heart, brain, nerves, lungs, other parts of body or loss of life  Patient voiced understanding and assent.)         Anesthesia Quick Evaluation

## 2024-07-15 NOTE — Discharge Instructions (Signed)

## 2024-07-17 ENCOUNTER — Encounter: Payer: Self-pay | Admitting: Ophthalmology

## 2024-07-17 ENCOUNTER — Ambulatory Visit: Payer: Self-pay | Admitting: Anesthesiology

## 2024-07-17 ENCOUNTER — Ambulatory Visit
Admission: RE | Admit: 2024-07-17 | Discharge: 2024-07-17 | Disposition: A | Discharge status: Home / Self Care | Attending: Ophthalmology | Admitting: Ophthalmology

## 2024-07-17 ENCOUNTER — Encounter
Admission: RE | Disposition: A | Discharge status: Home / Self Care | Payer: Self-pay | Source: Home / Self Care | Attending: Ophthalmology

## 2024-07-17 ENCOUNTER — Other Ambulatory Visit: Payer: Self-pay

## 2024-07-17 DIAGNOSIS — Z87891 Personal history of nicotine dependence: Secondary | ICD-10-CM | POA: Insufficient documentation

## 2024-07-17 DIAGNOSIS — H2512 Age-related nuclear cataract, left eye: Secondary | ICD-10-CM | POA: Diagnosis present

## 2024-07-17 DIAGNOSIS — Z5941 Food insecurity: Secondary | ICD-10-CM | POA: Diagnosis not present

## 2024-07-17 HISTORY — PX: CATARACT EXTRACTION W/PHACO: SHX586

## 2024-07-17 SURGERY — PHACOEMULSIFICATION, CATARACT, WITH IOL INSERTION
Anesthesia: Monitor Anesthesia Care | Site: Eye | Laterality: Left

## 2024-07-17 MED ORDER — SIGHTPATH DOSE#1 BSS IO SOLN
INTRAOCULAR | Status: DC | PRN
  Administered 2024-07-17: 91 mL via OPHTHALMIC

## 2024-07-17 MED ORDER — ARMC OPHTHALMIC DILATING DROPS
OPHTHALMIC | Status: AC
  Filled 2024-07-17: qty 0.5

## 2024-07-17 MED ORDER — MOXIFLOXACIN HCL 0.5 % OP SOLN
OPHTHALMIC | Status: DC | PRN
  Administered 2024-07-17: .2 mL via OPHTHALMIC

## 2024-07-17 MED ORDER — BRIMONIDINE TARTRATE-TIMOLOL 0.2-0.5 % OP SOLN
OPHTHALMIC | Status: DC | PRN
  Administered 2024-07-17: 1 [drp] via OPHTHALMIC

## 2024-07-17 MED ORDER — FENTANYL CITRATE (PF) 100 MCG/2ML IJ SOLN
INTRAMUSCULAR | Status: AC
  Filled 2024-07-17: qty 2

## 2024-07-17 MED ORDER — SIGHTPATH DOSE#1 NA HYALUR & NA CHOND-NA HYALUR IO KIT
PACK | INTRAOCULAR | Status: DC | PRN
  Administered 2024-07-17: 1 via OPHTHALMIC

## 2024-07-17 MED ORDER — LACTATED RINGERS IV SOLN: INTRAVENOUS | Status: DC

## 2024-07-17 MED ORDER — TETRACAINE HCL 0.5 % OP SOLN
1.0000 [drp] | OPHTHALMIC | Status: DC | PRN
  Administered 2024-07-17 (×3): 1 [drp] via OPHTHALMIC

## 2024-07-17 MED ORDER — TETRACAINE HCL 0.5 % OP SOLN
OPHTHALMIC | Status: AC
  Filled 2024-07-17: qty 4

## 2024-07-17 MED ORDER — SIGHTPATH DOSE#1 BSS IO SOLN
INTRAOCULAR | Status: DC | PRN
  Administered 2024-07-17: 15 mL via INTRAOCULAR

## 2024-07-17 MED ORDER — LIDOCAINE HCL (PF) 2 % IJ SOLN
INTRAOCULAR | Status: DC | PRN
  Administered 2024-07-17: 4 mL via INTRAOCULAR

## 2024-07-17 MED ORDER — ARMC OPHTHALMIC DILATING DROPS
1.0000 | OPHTHALMIC | Status: DC | PRN
  Administered 2024-07-17 (×3): 1 via OPHTHALMIC

## 2024-07-17 MED ORDER — MIDAZOLAM HCL 2 MG/2ML IJ SOLN
INTRAMUSCULAR | Status: AC
  Filled 2024-07-17: qty 2

## 2024-07-17 MED ORDER — MIDAZOLAM HCL 5 MG/5ML IJ SOLN
INTRAMUSCULAR | Status: DC | PRN
  Administered 2024-07-17: 2 mg via INTRAVENOUS

## 2024-07-17 MED ORDER — FENTANYL CITRATE (PF) 100 MCG/2ML IJ SOLN
INTRAMUSCULAR | Status: DC | PRN
  Administered 2024-07-17 (×2): 50 ug via INTRAVENOUS

## 2024-07-17 SURGICAL SUPPLY — 11 items
CATARACT SUITE SIGHTPATH (MISCELLANEOUS) ×1 IMPLANT
DISSECTOR HYDRO NUCLEUS 50X22 (MISCELLANEOUS) ×1 IMPLANT
DRSG TEGADERM 2-3/8X2-3/4 SM (GAUZE/BANDAGES/DRESSINGS) ×1 IMPLANT
FEE CATARACT SUITE SIGHTPATH (MISCELLANEOUS) ×1 IMPLANT
GLOVE BIOGEL PI IND STRL 8 (GLOVE) ×1 IMPLANT
GLOVE SURG LX STRL 7.5 STRW (GLOVE) ×1 IMPLANT
GLOVE SURG SYN 6.5 PF PI BL (GLOVE) ×1 IMPLANT
LENS IOL ENVISTA UV+ 21.0 (Intraocular Lens) IMPLANT
NDL FILTER BLUNT 18X1 1/2 (NEEDLE) ×1 IMPLANT
NEEDLE FILTER BLUNT 18X1 1/2 (NEEDLE) ×1 IMPLANT
SYR 3ML LL SCALE MARK (SYRINGE) ×1 IMPLANT

## 2024-07-17 NOTE — Op Note (Signed)
 OPERATIVE NOTE  Kelly Sanchez [id number] 07/17/2024   PREOPERATIVE DIAGNOSIS: Nuclear sclerotic cataract left eye. H25.12   POSTOPERATIVE DIAGNOSIS: Nuclear sclerotic cataract left eye. H25.12   PROCEDURE:  Phacoemusification with posterior chamber intraocular lens placement of the left eye  Ultrasound time: Procedure(s): PHACOEMULSIFICATION, CATARACT, WITH IOL INSERTION 16.27 01:19.1 (Left)  LENS:   Implant Name Type Inv. Item Serial No. Manufacturer Lot No. LRB No. Used Action  LENS IOL ENVISTA UV+ 21.0 - [id number] Intraocular Lens LENS IOL ENVISTA UV+ 21.0 [id number] [employer] [id number] Left 1 Implanted      SURGEON:  Feliciano HERO. [name], MD   ANESTHESIA:  Topical with tetracaine  drops, augmented with 1% preservative-free intracameral lidocaine .   COMPLICATIONS:  None.   DESCRIPTION OF PROCEDURE:  The patient was identified in the holding room and transported to the operating room and placed in the supine position under the operating microscope.  The left eye was identified as the operative eye, which was prepped and draped in the usual sterile ophthalmic fashion.   A 1 millimeter clear-corneal paracentesis was made inferotemporally. Preservative-free 1% lidocaine  mixed with 1:1,000 bisulfite-free aqueous solution of epinephrine  was injected into the anterior chamber. The anterior chamber was then filled with Viscoat viscoelastic. A 2.4 millimeter keratome was used to make a clear-corneal incision superotemporally. A curvilinear capsulorrhexis was made with a cystotome and capsulorrhexis forceps. Balanced salt solution was used to hydrodissect and hydrodelineate the nucleus. Phacoemulsification was then used to remove the lens nucleus and epinucleus. The remaining cortex was then removed using the irrigation and aspiration handpiece. Provisc was then placed into the capsular bag to distend it for lens placement. A +21.00 D EE intraocular lens was then injected into the capsular bag. The  remaining viscoelastic was aspirated.   Wounds were hydrated with balanced salt solution.  The anterior chamber was inflated to a physiologic pressure with balanced salt solution.  No wound leaks were noted. Moxifloxacin  was injected intracamerally.  Timolol  and Brimonidine  drops were applied to the eye.  The patient was taken to the recovery room in stable condition without complications of anesthesia or surgery.  Feliciano Hugger [place] 07/17/2024, 8:21 AM

## 2024-07-17 NOTE — Anesthesia Postprocedure Evaluation (Signed)
 Anesthesia Post Note  Patient: Kelly Sanchez  Procedure(s) Performed: PHACOEMULSIFICATION, CATARACT, WITH IOL INSERTION 16.27 01:19.1 (Left: Eye)  Patient location during evaluation: PACU Anesthesia Type: MAC Level of consciousness: awake and alert Pain management: pain level controlled Vital Signs Assessment: post-procedure vital signs reviewed and stable Respiratory status: spontaneous breathing, nonlabored ventilation, respiratory function stable and patient connected to nasal cannula oxygen Cardiovascular status: stable and blood pressure returned to baseline Postop Assessment: no apparent nausea or vomiting Anesthetic complications: no   No notable events documented.   Last Vitals:  Vitals:   07/17/24 0821 07/17/24 0826  BP: (!) 120/56 115/67  Pulse: (!) 43 (!) 43  Resp: 16 11  Temp: (!) 36.3 C (!) 36.3 C  SpO2: 99% 100%    Last Pain:  Vitals:   07/17/24 0826  TempSrc:   PainSc: 0-No pain                 Donny JAYSON Mu

## 2024-07-17 NOTE — H&P (Signed)
 [facility]   Primary Care Physician:  Missy Mallie MATSU, MD Ophthalmologist: Dr. Feliciano Ober  Pre-Procedure History & Physical: HPI:  Kelly Sanchez is a 73 y.o. female here for cataract surgery.   Past Medical History:  Diagnosis Date   Arthritis    Cancer [facility])     Past Surgical History:  Procedure Laterality Date   APPENDECTOMY     CARDIAC CATHETERIZATION     CATARACT EXTRACTION W/PHACO Right 07/07/2024   Procedure: PHACOEMULSIFICATION, CATARACT, WITH IOL INSERTION 18.02 01:28.7;  Surgeon: Ober Feliciano Hugger, MD;  Location: [facility] ORS;  Service: Ophthalmology;  Laterality: Right;   CHOLECYSTECTOMY     TOTAL KNEE ARTHROPLASTY Left     Prior to Admission medications   Medication Sig Start Date End Date Taking? Authorizing Provider  aspirin EC 81 MG tablet Take 81 mg by mouth daily. Swallow whole.    [provider]  metoprolol succinate (TOPROL-XL) 25 MG 24 hr tablet Take 25 mg by mouth every morning. 12/04/21   [provider]  omeprazole  (PRILOSEC) 40 MG capsule Take 1 capsule (40 mg total) by mouth daily before breakfast. 09/19/21   Edman, Marsa PARAS, DO  oxyCODONE -acetaminophen  (PERCOCET/ROXICET) 5-325 MG tablet Take 1 tablet by mouth in the morning, at noon, and at bedtime.    [provider]  rosuvastatin  (CRESTOR ) 20 MG tablet Take 1 tablet (20 mg total) by mouth every evening. 04/07/22   Karamalegos, Marsa PARAS, DO  gabapentin (NEURONTIN) 300 MG capsule Take by mouth. 09/04/18 12/09/19  [provider]    Allergies as of 04/22/2024   (No Known Allergies)    Family History  Problem Relation Age of Onset   Dementia Mother    Heart attack Father     Social History   Socioeconomic History   Marital status: Widowed    Spouse name: Not on file   Number of children: Not on file   Years of education: Not on file   Highest education level: Not on file  Occupational History   Not on file  Tobacco Use   Smoking  status: Former    Current packs/day: 0.50    Types: Cigarettes   Smokeless tobacco: Never  Vaping Use   Vaping status: Never Used  Substance and Sexual Activity   Alcohol use: No   Drug use: Yes    Types: Marijuana   Sexual activity: Never    Birth control/protection: Post-menopausal  Other Topics Concern   Not on file  Social History Narrative   Not on file   Social Drivers of Health   Financial Resource Strain: Low Risk  (08/18/2020)   Received from [facility]   Overall Financial Resource Strain (CARDIA)    Difficulty of Paying Living Expenses: Not hard at all  Food Insecurity: Food Insecurity Present (06/11/2024)   Received from [facility]   Hunger Vital Sign    Within the past 12 months, you worried that your food would run out before you got the money to buy more.: Never true    Within the past 12 months, the food you bought just didn't last and you didn't have money to get more.: Sometimes true  Transportation Needs: No Transportation Needs (06/11/2024)   Received from [facility]   PRAPARE - Transportation    Lack of Transportation (Medical): No    Lack of Transportation (Non-Medical): No  Physical Activity: Not on file  Stress: Not on file  Social Connections: Unknown (02/20/2023)  Received from [facility]   Social Network    Social Network: Not on file  Intimate Partner Violence: Not At Risk (03/05/2023)   Received from [facility] and [place]  [facility]   Humiliation, Afraid, Rape, and Kick questionnaire    Within the last year, have you been afraid of your partner or ex-partner?: No    Within the last year, have you been humiliated or emotionally abused in other ways by your partner or ex-partner?: No    Within the last year, have you been kicked, hit, slapped, or otherwise physically hurt by your partner or ex-partner?: No    Within the last year, have you been raped or forced to have any kind of sexual activity by your partner or ex-partner?:  No    Review of Systems: See HPI, otherwise negative ROS  Physical Exam: Ht 5' 2.01 (1.575 m)   Wt 57.8 kg   BMI 23.29 kg/m  General:   Alert, cooperative in NAD Head:  Normocephalic and atraumatic. Respiratory:  Normal work of breathing. Cardiovascular:  RRR  Impression/Plan: Kelly Sanchez is here for cataract surgery.  Risks, benefits, limitations, and alternatives regarding cataract surgery have been reviewed with the patient.  Questions have been answered.  All parties agreeable.   Feliciano Bryan Ober, MD  07/17/2024, 7:07 AM

## 2024-07-17 NOTE — Transfer of Care (Signed)
 Immediate Anesthesia Transfer of Care Note  Patient: Kelly Sanchez  Procedure(s) Performed: PHACOEMULSIFICATION, CATARACT, WITH IOL INSERTION 16.27 01:19.1 (Left: Eye)  Patient Location: PACU  Anesthesia Type: MAC  Level of Consciousness: awake, alert  and patient cooperative  Airway and Oxygen Therapy: Patient Spontanous Breathing and Patient connected to supplemental oxygen  Post-op Assessment: Post-op Vital signs reviewed, Patient's Cardiovascular Status Stable, Respiratory Function Stable, Patent Airway and No signs of Nausea or vomiting  Post-op Vital Signs: Reviewed and stable  Complications: No notable events documented.

## 2024-11-09 ENCOUNTER — Emergency Department (HOSPITAL_COMMUNITY)

## 2024-11-09 ENCOUNTER — Inpatient Hospital Stay (HOSPITAL_COMMUNITY)

## 2024-11-09 ENCOUNTER — Observation Stay (HOSPITAL_COMMUNITY)
Admission: EM | Admit: 2024-11-09 | Discharge: 2024-11-11 | Disposition: A | Discharge status: Home / Self Care | Attending: Internal Medicine | Admitting: Internal Medicine

## 2024-11-09 ENCOUNTER — Other Ambulatory Visit: Payer: Self-pay

## 2024-11-09 ENCOUNTER — Encounter (HOSPITAL_COMMUNITY): Payer: Self-pay | Admitting: Emergency Medicine

## 2024-11-09 DIAGNOSIS — R001 Bradycardia, unspecified: Secondary | ICD-10-CM | POA: Diagnosis not present

## 2024-11-09 DIAGNOSIS — E876 Hypokalemia: Secondary | ICD-10-CM | POA: Insufficient documentation

## 2024-11-09 DIAGNOSIS — R634 Abnormal weight loss: Secondary | ICD-10-CM

## 2024-11-09 DIAGNOSIS — E869 Volume depletion, unspecified: Secondary | ICD-10-CM | POA: Diagnosis not present

## 2024-11-09 DIAGNOSIS — K838 Other specified diseases of biliary tract: Secondary | ICD-10-CM | POA: Insufficient documentation

## 2024-11-09 DIAGNOSIS — R112 Nausea with vomiting, unspecified: Secondary | ICD-10-CM | POA: Diagnosis not present

## 2024-11-09 DIAGNOSIS — R1013 Epigastric pain: Secondary | ICD-10-CM | POA: Diagnosis not present

## 2024-11-09 DIAGNOSIS — K8689 Other specified diseases of pancreas: Secondary | ICD-10-CM | POA: Diagnosis not present

## 2024-11-09 DIAGNOSIS — Z85828 Personal history of other malignant neoplasm of skin: Secondary | ICD-10-CM | POA: Diagnosis not present

## 2024-11-09 DIAGNOSIS — Z800 Family history of malignant neoplasm of digestive organs: Secondary | ICD-10-CM

## 2024-11-09 DIAGNOSIS — Z87891 Personal history of nicotine dependence: Secondary | ICD-10-CM | POA: Insufficient documentation

## 2024-11-09 DIAGNOSIS — K2970 Gastritis, unspecified, without bleeding: Secondary | ICD-10-CM | POA: Insufficient documentation

## 2024-11-09 DIAGNOSIS — Z7982 Long term (current) use of aspirin: Secondary | ICD-10-CM | POA: Diagnosis not present

## 2024-11-09 DIAGNOSIS — Z79899 Other long term (current) drug therapy: Secondary | ICD-10-CM | POA: Diagnosis not present

## 2024-11-09 DIAGNOSIS — D649 Anemia, unspecified: Secondary | ICD-10-CM | POA: Insufficient documentation

## 2024-11-09 LAB — RETICULOCYTES
Immature Retic Fract: 12.8 % (ref 2.3–15.9)
RBC.: 3.85 MIL/uL — ABNORMAL LOW (ref 3.87–5.11)
Retic Count, Absolute: 52.4 K/uL (ref 19.0–186.0)
Retic Ct Pct: 1.4 % (ref 0.4–3.1)

## 2024-11-09 LAB — COMPREHENSIVE METABOLIC PANEL WITH GFR
ALT: 17 U/L (ref 0–44)
AST: 30 U/L (ref 15–41)
Albumin: 4.4 g/dL (ref 3.5–5.0)
Alkaline Phosphatase: 100 U/L (ref 38–126)
Anion gap: 12 (ref 5–15)
BUN: 8 mg/dL (ref 8–23)
CO2: 25 mmol/L (ref 22–32)
Calcium: 9.1 mg/dL (ref 8.9–10.3)
Chloride: 107 mmol/L (ref 98–111)
Creatinine, Ser: 1.1 mg/dL — ABNORMAL HIGH (ref 0.44–1.00)
GFR, Estimated: 53 mL/min — ABNORMAL LOW (ref >60)
Glucose, Bld: 103 mg/dL — ABNORMAL HIGH (ref 70–99)
Potassium: 2.9 mmol/L — ABNORMAL LOW (ref 3.5–5.1)
Sodium: 143 mmol/L (ref 135–145)
Total Bilirubin: 0.5 mg/dL (ref 0.0–1.2)
Total Protein: 7.1 g/dL (ref 6.5–8.1)

## 2024-11-09 LAB — IRON AND TIBC
Iron: 42 ug/dL (ref 28–170)
Saturation Ratios: 11 % (ref 10.4–31.8)
TIBC: 391 ug/dL (ref 250–450)
UIBC: 349 ug/dL

## 2024-11-09 LAB — VITAMIN B12: Vitamin B-12: 1147 pg/mL — ABNORMAL HIGH (ref 180–914)

## 2024-11-09 LAB — URINALYSIS, ROUTINE W REFLEX MICROSCOPIC
Bacteria, UA: NONE SEEN
Bilirubin Urine: NEGATIVE
Glucose, UA: NEGATIVE mg/dL
Ketones, ur: NEGATIVE mg/dL
Leukocytes,Ua: NEGATIVE
Nitrite: NEGATIVE
Protein, ur: 30 mg/dL — AB
Specific Gravity, Urine: 1.004 — ABNORMAL LOW (ref 1.005–1.030)
pH: 8 (ref 5.0–8.0)

## 2024-11-09 LAB — CBC
HCT: 34.9 % — ABNORMAL LOW (ref 36.0–46.0)
Hemoglobin: 11.7 g/dL — ABNORMAL LOW (ref 12.0–15.0)
MCH: 30.9 pg (ref 26.0–34.0)
MCHC: 33.5 g/dL (ref 30.0–36.0)
MCV: 92.1 fL (ref 80.0–100.0)
Platelets: 160 K/uL (ref 150–400)
RBC: 3.79 MIL/uL — ABNORMAL LOW (ref 3.87–5.11)
RDW: 13.9 % (ref 11.5–15.5)
WBC: 6.5 K/uL (ref 4.0–10.5)
nRBC: 0 % (ref 0.0–0.2)

## 2024-11-09 LAB — FOLATE: Folate: 8.6 ng/mL (ref >5.9)

## 2024-11-09 LAB — TROPONIN T, HIGH SENSITIVITY
Troponin T High Sensitivity: <15 ng/L (ref 0–19)
Troponin T High Sensitivity: <15 ng/L (ref 0–19)

## 2024-11-09 LAB — LIPASE, BLOOD: Lipase: 55 U/L — ABNORMAL HIGH (ref 11–51)

## 2024-11-09 LAB — FERRITIN: Ferritin: 33 ng/mL (ref 11–307)

## 2024-11-09 MED ORDER — HEPARIN SODIUM (PORCINE) 5000 UNIT/ML IJ SOLN
5000.0000 [IU] | Freq: Three times a day (TID) | INTRAMUSCULAR | Status: DC
  Administered 2024-11-10 – 2024-11-11 (×3): 5000 [IU] via SUBCUTANEOUS
  Filled 2024-11-09 (×3): qty 1

## 2024-11-09 MED ORDER — TRAZODONE HCL 50 MG PO TABS: 25.0000 mg | ORAL_TABLET | Freq: Every evening | ORAL | Status: DC | PRN

## 2024-11-09 MED ORDER — MORPHINE SULFATE (PF) 4 MG/ML IV SOLN
4.0000 mg | Freq: Once | INTRAVENOUS | Status: AC
  Administered 2024-11-09: 4 mg via INTRAVENOUS
  Filled 2024-11-09: qty 1

## 2024-11-09 MED ORDER — FAMOTIDINE IN NACL 20-0.9 MG/50ML-% IV SOLN
20.0000 mg | Freq: Once | INTRAVENOUS | Status: AC
  Administered 2024-11-09: 20 mg via INTRAVENOUS
  Filled 2024-11-09: qty 50

## 2024-11-09 MED ORDER — SODIUM CHLORIDE 0.9 % IV BOLUS
1000.0000 mL | Freq: Once | INTRAVENOUS | Status: AC
  Administered 2024-11-09: 1000 mL via INTRAVENOUS

## 2024-11-09 MED ORDER — LACTATED RINGERS IV SOLN
INTRAVENOUS | Status: AC
  Administered 2024-11-09: 1000 mL via INTRAVENOUS

## 2024-11-09 MED ORDER — POTASSIUM CHLORIDE 10 MEQ/100ML IV SOLN
10.0000 meq | INTRAVENOUS | Status: AC
Start: 2024-11-09 — End: 2024-11-09
  Administered 2024-11-09 (×3): 10 meq via INTRAVENOUS
  Filled 2024-11-09 (×3): qty 100

## 2024-11-09 MED ORDER — ONDANSETRON HCL 4 MG PO TABS
4.0000 mg | ORAL_TABLET | Freq: Four times a day (QID) | ORAL | Status: DC | PRN
  Administered 2024-11-10: 4 mg via ORAL
  Filled 2024-11-09: qty 1

## 2024-11-09 MED ORDER — ONDANSETRON HCL 4 MG/2ML IJ SOLN
4.0000 mg | Freq: Once | INTRAMUSCULAR | Status: AC
  Administered 2024-11-09: 4 mg via INTRAVENOUS
  Filled 2024-11-09: qty 2

## 2024-11-09 MED ORDER — OXYCODONE HCL 5 MG PO TABS
5.0000 mg | ORAL_TABLET | ORAL | Status: DC | PRN
  Administered 2024-11-09 – 2024-11-11 (×4): 5 mg via ORAL
  Filled 2024-11-09 (×4): qty 1

## 2024-11-09 MED ORDER — IOHEXOL 300 MG/ML  SOLN
100.0000 mL | Freq: Once | INTRAMUSCULAR | Status: AC | PRN
  Administered 2024-11-09: 100 mL via INTRAVENOUS

## 2024-11-09 MED ORDER — ACETAMINOPHEN 650 MG RE SUPP: 650.0000 mg | Freq: Four times a day (QID) | RECTAL | Status: DC | PRN

## 2024-11-09 MED ORDER — MORPHINE SULFATE (PF) 2 MG/ML IV SOLN
2.0000 mg | INTRAVENOUS | Status: DC | PRN
  Administered 2024-11-10 – 2024-11-11 (×2): 2 mg via INTRAVENOUS
  Filled 2024-11-09 (×3): qty 1

## 2024-11-09 MED ORDER — ACETAMINOPHEN 325 MG PO TABS: 650.0000 mg | ORAL_TABLET | Freq: Four times a day (QID) | ORAL | Status: DC | PRN

## 2024-11-09 MED ORDER — ONDANSETRON HCL 4 MG/2ML IJ SOLN: 4.0000 mg | Freq: Four times a day (QID) | INTRAMUSCULAR | Status: DC | PRN

## 2024-11-09 NOTE — ED Provider Notes (Signed)
 AP-EMERGENCY DEPT [facility] Emergency Department Provider Note MRN:  [id number]  Arrival date & time: 11/09/24     Chief Complaint   Emesis and Loss of Consciousness   History of Present Illness   Kelly Sanchez is a 73 y.o. year-old female with no pertinent past medical history presenting to the ED with chief complaint of emesis.  1 week of persistent epigastric abdominal pain and nausea vomiting.  No diarrhea or constipation.  Becoming dehydrated, losing weight, thinks she passed out in the bathroom this evening and fell.  Having some neck pain.  Worried that she has cancer.  Review of Systems  A thorough review of systems was obtained and all systems are negative except as noted in the HPI and PMH.   Patient's Health History    Past Medical History:  Diagnosis Date   Arthritis    Cancer [facility])     Past Surgical History:  Procedure Laterality Date   APPENDECTOMY     CARDIAC CATHETERIZATION     CATARACT EXTRACTION W/PHACO Right 07/07/2024   Procedure: PHACOEMULSIFICATION, CATARACT, WITH IOL INSERTION 18.02 01:28.7;  Surgeon: [name] Feliciano Hugger, MD;  Location: [facility] ORS;  Service: Ophthalmology;  Laterality: Right;   CATARACT EXTRACTION W/PHACO Left 07/17/2024   Procedure: PHACOEMULSIFICATION, CATARACT, WITH IOL INSERTION 16.27 01:19.1;  Surgeon: [name] Feliciano Hugger, MD;  Location: [facility];  Service: Ophthalmology;  Laterality: Left;   CHOLECYSTECTOMY     TOTAL KNEE ARTHROPLASTY Left     Family History  Problem Relation Age of Onset   Dementia Mother    Heart attack Father     Social History   Socioeconomic History   Marital status: Widowed    Spouse name: Not on file   Number of children: Not on file   Years of education: Not on file   Highest education level: Not on file  Occupational History   Not on file  Tobacco Use   Smoking status: Former    Current packs/day: 0.50    Types: Cigarettes   Smokeless tobacco: Never  Vaping Use    Vaping status: Never Used  Substance and Sexual Activity   Alcohol use: No   Drug use: Yes    Types: Marijuana   Sexual activity: Never    Birth control/protection: Post-menopausal  Other Topics Concern   Not on file  Social History Narrative   Not on file   Social Drivers of Health   Financial Resource Strain: Low Risk (08/18/2020)   Received from [facility]   Overall Financial Resource Strain (CARDIA)    Difficulty of Paying Living Expenses: Not hard at all  Food Insecurity: Food Insecurity Present (06/11/2024)   Received from [facility]   Hunger Vital Sign    Within the past 12 months, you worried that your food would run out before you got the money to buy more.: Never true    Within the past 12 months, the food you bought just didn't last and you didn't have money to get more.: Sometimes true  Transportation Needs: No Transportation Needs (06/11/2024)   Received [facility]   PRAPARE - Transportation    Lack of Transportation (Medical): No    Lack of Transportation (Non-Medical): No  Physical Activity: Not on file  Stress: Not on file  Social Connections: Unknown (02/20/2023)   Received from [facility]   Social Network    Social Network: Not on file  Intimate Partner Violence: Not At Risk (03/05/2023)  [facility]   Humiliation, Afraid, Rape, and Kick questionnaire    Within the last year, have you been afraid of your partner or ex-partner?: No    Within the last year, have you been humiliated or emotionally abused in other ways by your partner or ex-partner?: No    Within the last year, have you been kicked, hit, slapped, or otherwise physically hurt by your partner or ex-partner?: No    Within the last year, have you been raped or forced to have any kind of sexual activity by your partner or ex-partner?: No     Physical Exam   Vitals:   11/09/24 0400 11/09/24 0510  BP: (!) 142/82 134/60  Pulse: 67 (!) 47   Resp: 10 11  Temp:    SpO2: 96% 97%    CONSTITUTIONAL: Well-appearing, NAD NEURO/PSYCH:  Alert and oriented x 3, no focal deficits EYES:  eyes equal and reactive ENT/NECK:  no LAD, no JVD CARDIO: Regular rate, well-perfused, normal S1 and S2 PULM:  CTAB no wheezing or rhonchi GI/GU:  non-distended, moderate epigastric tenderness to palpation MSK/SPINE:  No gross deformities, no edema SKIN:  no rash, atraumatic   *Additional and/or pertinent findings included in MDM below  Diagnostic and Interventional Summary    EKG Interpretation Date/Time:  Sunday November 09 2024 03:55:09 EST Ventricular Rate:  73 PR Interval:  180 QRS Duration:  87 QT Interval:  411 QTC Calculation: 453 R Axis:   76  Text Interpretation: Sinus rhythm Consider left atrial enlargement Confirmed by Theadore Sharper [phone]) on 11/09/2024 4:50:54 AM       Labs Reviewed  COMPREHENSIVE METABOLIC PANEL WITH GFR - Abnormal; Notable for the following components:      Result Value   Potassium 2.9 (*)    Glucose, Bld 103 (*)    Creatinine, Ser 1.10 (*)    GFR, Estimated 53 (*)    All other components within normal limits  CBC - Abnormal; Notable for the following components:   RBC 3.79 (*)    Hemoglobin 11.7 (*)    HCT 34.9 (*)    All other components within normal limits  URINALYSIS, ROUTINE W REFLEX MICROSCOPIC - Abnormal; Notable for the following components:   Color, Urine STRAW (*)    Specific Gravity, Urine 1.004 (*)    Hgb urine dipstick MODERATE (*)    Protein, ur 30 (*)    All other components within normal limits  LIPASE, BLOOD - Abnormal; Notable for the following components:   Lipase 55 (*)    All other components within normal limits  CBG MONITORING, ED  TROPONIN T, HIGH SENSITIVITY  TROPONIN T, HIGH SENSITIVITY    CT ABDOMEN PELVIS W CONTRAST  Final Result    CT HEAD WO CONTRAST ( )  Final Result    CT CERVICAL SPINE WO CONTRAST  Final Result    US  Abdomen Limited RUQ  (LIVER/GB)    (Results Pending)    Medications  potassium chloride 10 mEq in 100 mL IVPB (10 mEq Intravenous New Bag/Given 11/09/24 0608)  sodium chloride 0.9 % bolus 1,000 mL (1,000 mLs Intravenous New Bag/Given 11/09/24 0420)  famotidine (PEPCID) IVPB 20 mg premix (0 mg Intravenous Stopped 11/09/24 0451)  morphine  (PF) 4 MG/ML injection 4 mg (4 mg Intravenous Given 11/09/24 0417)  ondansetron  (ZOFRAN ) injection 4 mg (4 mg Intravenous Given 11/09/24 0417)  iohexol (OMNIPAQUE) 300 MG/ML solution 100 mL (100 mLs Intravenous Contrast Given 11/09/24 0442)  Procedures  /  Critical Care Procedures  ED Course and Medical Decision Making  Initial Impression and Ddx Differential diagnosis includes gastroenteritis, SBO, pancreatitis, biliary colic.  Past medical/surgical history that increases complexity of ED encounter: None  Interpretation of Diagnostics I personally reviewed the Laboratory Testing and my interpretation is as follows: Mild hypokalemia.  Normal LFTs and T. bili  CT revealing biliary dilation, both intrahepatic as well as pancreatic duct.  Patient Reassessment and Ultimate Disposition/Management     I discussed patient's case and imaging with Dr. Cinderella of gastroenterology.  Patient has double duct sign and needs both ERCP and EUS for further evaluation.  Could be related to a stone, however pancreatic cancer is also on the differential.  Will reach out to [facility], GI, will admit to medicine.  Patient management required discussion with the following services or consulting groups:  Hospitalist Service and Gastroenterology  Complexity of Problems Addressed Acute illness or injury that poses threat of life of bodily function  Additional Data Reviewed and Analyzed Further history obtained from: Further history from spouse/family member  Additional Factors Impacting ED Encounter Risk Use of parenteral controlled substances and Consideration of  hospitalization  Ozell HERO. Theadore, MD [facility] Emergency Medicine [facility] [email or web address]  Final Clinical Impressions(s) / ED Diagnoses     ICD-10-CM   1. Nausea and vomiting, unspecified vomiting type  R11.2     2. Epigastric pain  R10.13     3. Dilation of biliary tract  K83.8       ED Discharge Orders     None        Discharge Instructions Discussed with and Provided to Patient:   Discharge Instructions   None      Theadore Ozell HERO, MD 11/09/24 [phone]

## 2024-11-09 NOTE — Consult Note (Addendum)
 [name] [name], M.D. Gastroenterology & Hepatology                                           Patient Name: Kelly Sanchez  MRN: [id number] Admission Date: 11/09/2024 Date of Evaluation:  11/09/2024 Time of Evaluation: 11:42 AM  Chief Complaint:  Abdominal pain , Nausea , vomiting , unintentional weight loss  HPI:  This is a 73 y.o. female with history of chronic pain on opioids , family history of colon cancer ( brothe and sister) , CAD s/p PCI presented with abdominal pain , postprandial nausea vomiting unintentional weight loss  Patient seen and examined in ER today.  Reports that for past few weeks she has epigastric pain which is mostly after eating.  Denies any NSAIDs but takes chronic opioids for knee pain.  Patient reports for past 1 week she would bring up undigested food 15 to 20 minutes after eating.  She is unable to keep anything down at this time.  During the same time she has lost 5 to 6 pounds  Labs from 10/2024 potassium 2.9 creatinine 1.1 AST 30 ALT 17 T. bili 0.5 Hemoglobin 11.7 platelet 160  CT demonstrating Moderate intrahepatic and extrahepatic biliary ductal dilatation, with the common hepatic duct measuring up to 19 mm and the common bile duct measuring up to 13 mm; Abnormal dilation of the main pancreatic duct, measuring approximately 6 mm in the pancreatic head.  Ultrasound with CBD of 1.5  FH: Brother and sister with colon cancer  SH: Patient vapes daily   Endoscopic history   Colonoscopy : 04/2024 [facility]   Impression:            - Perianal skin tags found on perianal exam.                         - One 4 mm polyp at the hepatic flexure, removed with                         a cold snare. Resected and retrieved.                         - The examination was otherwise normal.  EGD 07/2023  Impression:            - Normal esophagus.                         - Z-line regular, 39 cm from the incisors.                         - Gastritis, characterized by  adherent blood, erosions                         and erythema. Biopsied.                         - Normal examined duodenum.   Past Medical History: SEE CHRONIC ISSSUES: Past Medical History:  Diagnosis Date   Arthritis    Cancer [facility])    Past Surgical History:  Past Surgical History:  Procedure Laterality Date   APPENDECTOMY     CARDIAC CATHETERIZATION     CATARACT  EXTRACTION W/PHACO Right 07/07/2024   Procedure: PHACOEMULSIFICATION, CATARACT, WITH IOL INSERTION 18.02 01:28.7;  Surgeon: [name] Feliciano Hugger, MD;  Location: [facility] ORS;  Service: Ophthalmology;  Laterality: Right;   CATARACT EXTRACTION W/PHACO Left 07/17/2024   Procedure: PHACOEMULSIFICATION, CATARACT, WITH IOL INSERTION 16.27 01:19.1;  Surgeon: [name] Feliciano Hugger, MD;  Location: [facility];  Service: Ophthalmology;  Laterality: Left;   CHOLECYSTECTOMY     TOTAL KNEE ARTHROPLASTY Left    Family History:  Family History  Problem Relation Age of Onset   Dementia Mother    Heart attack Father    Social History:  Social History   Tobacco Use   Smoking status: Former    Current packs/day: 0.50    Types: Cigarettes   Smokeless tobacco: Never  Vaping Use   Vaping status: Never Used  Substance Use Topics   Alcohol use: No   Drug use: Yes    Types: Marijuana    Home Medications:  Prior to Admission medications   Medication Sig Start Date End Date Taking? Authorizing Provider  aspirin EC 81 MG tablet Take 81 mg by mouth daily. Swallow whole.    [provider]  metoprolol succinate (TOPROL-XL) 25 MG 24 hr tablet Take 25 mg by mouth every morning. 12/04/21   [provider]  omeprazole  (PRILOSEC) 40 MG capsule Take 1 capsule (40 mg total) by mouth daily before breakfast. 09/19/21   Edman, Marsa PARAS, DO  oxyCODONE -acetaminophen  (PERCOCET/ROXICET) 5-325 MG tablet Take 1 tablet by mouth in the morning, at noon, and at bedtime.    [provider]  rosuvastatin  (CRESTOR )  20 MG tablet Take 1 tablet (20 mg total) by mouth every evening. 04/07/22   Karamalegos, Marsa PARAS, DO  gabapentin (NEURONTIN) 300 MG capsule Take by mouth. 09/04/18 12/09/19  [provider]    Inpatient Medications:  Current Facility-Administered Medications:    acetaminophen  (TYLENOL ) tablet 650 mg, 650 mg, Oral, Q6H PRN **OR** acetaminophen  (TYLENOL ) suppository 650 mg, 650 mg, Rectal, Q6H PRN, Arlon, Derek W, DO   heparin injection 5,000 Units, 5,000 Units, Subcutaneous, Q8H, Calkins, Derek W, DO   lactated ringers  infusion, , Intravenous, Continuous, Arlon Carliss ORN, DO, Last Rate: 75 mL/hr at 11/09/24 0837, 1,000 mL at 11/09/24 0837   morphine  (PF) 2 MG/ML injection 2 mg, 2 mg, Intravenous, Q2H PRN, Arlon, Derek W, DO   ondansetron  (ZOFRAN ) tablet 4 mg, 4 mg, Oral, Q6H PRN **OR** ondansetron  (ZOFRAN ) injection 4 mg, 4 mg, Intravenous, Q6H PRN, Arlon, Derek W, DO   oxyCODONE  (Oxy IR/ROXICODONE ) immediate release tablet 5 mg, 5 mg, Oral, Q4H PRN, Arlon, Derek W, DO   traZODone (DESYREL) tablet 25 mg, 25 mg, Oral, QHS PRN, Arlon Carliss ORN, DO  Current Outpatient Medications:    aspirin EC 81 MG tablet, Take 81 mg by mouth daily. Swallow whole., Disp: , Rfl:    metoprolol succinate (TOPROL-XL) 25 MG 24 hr tablet, Take 25 mg by mouth every morning., Disp: , Rfl:    omeprazole  (PRILOSEC) 40 MG capsule, Take 1 capsule (40 mg total) by mouth daily before breakfast., Disp: 90 capsule, Rfl: 3   oxyCODONE -acetaminophen  (PERCOCET/ROXICET) 5-325 MG tablet, Take 1 tablet by mouth in the morning, at noon, and at bedtime., Disp: , Rfl:    rosuvastatin  (CRESTOR ) 20 MG tablet, Take 1 tablet (20 mg total) by mouth every evening., Disp: 30 tablet, Rfl: 12 Allergies: Patient has no known allergies.  Complete Review of Systems: GENERAL: negative for malaise, night sweats HEENT: No changes  in hearing or vision, no nose bleeds or other nasal problems. NECK: Negative for lumps, goiter, pain  and significant neck swelling RESPIRATORY: Negative for cough, wheezing CARDIOVASCULAR: Negative for chest pain, leg swelling, palpitations, orthopnea GI: SEE HPI MUSCULOSKELETAL: Negative for joint pain or swelling, back pain, and muscle pain. SKIN: Negative for lesions, rash PSYCH: Negative for sleep disturbance, mood disorder and recent psychosocial stressors. HEMATOLOGY Negative for prolonged bleeding, bruising easily, and swollen nodes. ENDOCRINE: Negative for cold or heat intolerance, polyuria, polydipsia and goiter. NEURO: negative for tremor, gait imbalance, syncope and seizures. The remainder of the review of systems is noncontributory.  Physical Exam: BP 127/73   Pulse (!) 43   Temp 97.9 F (36.6 C) (Oral)   Resp (!) 21   Ht 5' 2 (1.575 m)   Wt 58 kg   SpO2 98%   BMI 23.39 kg/m  GENERAL: The patient is AO x3, in no acute distress. HEENT: Head is normocephalic and atraumatic. EOMI are intact. Mouth is well hydrated and without lesions. NECK: Supple. No masses LUNGS: Clear to auscultation. No presence of rhonchi/wheezing/rales. Adequate chest expansion HEART: RRR, normal s1 and s2. ABDOMEN: Soft, nontender, no guarding, no peritoneal signs, and nondistended. BS +. No masses.  Laboratory Data CBC:     Component Value Date/Time   WBC 6.5 11/09/2024 0355   RBC 3.79 (L) 11/09/2024 0355   RBC 3.85 (L) 11/09/2024 0355   HGB 11.7 (L) 11/09/2024 0355   HGB 13.8 05/08/2014 1127   HCT 34.9 (L) 11/09/2024 0355   HCT 40.8 05/08/2014 1127   PLT 160 11/09/2024 0355   PLT 227 05/08/2014 1127   MCV 92.1 11/09/2024 0355   MCV 89 05/08/2014 1127   MCH 30.9 11/09/2024 0355   MCHC 33.5 11/09/2024 0355   RDW 13.9 11/09/2024 0355   RDW 14.7 (H) 05/08/2014 1127   LYMPHSABS 2,730 12/30/2021 1142   LYMPHSABS 3.7 (H) 05/08/2014 1127   MONOABS 0.5 05/08/2014 1127   EOSABS 130 12/30/2021 1142   EOSABS 0.1 05/08/2014 1127   BASOSABS 52 12/30/2021 1142   BASOSABS 0.1 05/08/2014 1127    COAG: No results found for: INR, PROTIME  BMP:     Latest Ref Rng & Units 11/09/2024    3:55 AM 07/12/2023    7:48 PM 12/30/2021   11:42 AM  BMP  Glucose 70 - 99 mg/dL 896  83  899   BUN 8 - 23 mg/dL 8  14  9    Creatinine 0.44 - 1.00 mg/dL 8.89  7.78  9.02   BUN/Creat Ratio 6 - 22 (calc)   NOT APPLICABLE   Sodium 135 - 145 mmol/L 143  137  143   Potassium 3.5 - 5.1 mmol/L 2.9  4.1  4.0   Chloride 98 - 111 mmol/L 107  106  110   CO2 22 - 32 mmol/L 25  22  26    Calcium  8.9 - 10.3 mg/dL 9.1  9.3  9.2     HEPATIC:     Latest Ref Rng & Units 11/09/2024    3:55 AM 07/12/2023    7:48 PM 12/30/2021   11:42 AM  Hepatic Function  Total Protein 6.5 - 8.1 g/dL 7.1  6.9  6.7   Albumin 3.5 - 5.0 g/dL 4.4  4.5    AST 15 - 41 U/L 30  31  18    ALT 0 - 44 U/L 17  28  12    Alk Phosphatase 38 - 126 U/L 100  60  Total Bilirubin 0.0 - 1.2 mg/dL 0.5  0.6  0.4     CARDIAC: No results found for: CKTOTAL, CKMB, CKMBINDEX, TROPONINI   Imaging: I personally reviewed and interpreted the available imaging.  03/2024 [facility] CT   1. Mild diffuse gastric wall thickening. Findings can be seen in the setting of gastritis. Prominent perigastric vessels/varices. Consider correlation with endoscopy.  2. Colonic diverticulosis. No evidence of diverticulitis.    CT 11/09/2024 05:22 AM   IMPRESSION: 1. Moderate intrahepatic and extrahepatic biliary ductal dilatation, with the common hepatic duct measuring up to 19 mm and the common bile duct measuring up to 13 mm; no clear obstructing mass identified, though a small mass or stricture at the sphincter of Oddi cannot be excluded. Correlate with endoscopy. 2. Abnormal dilation of the main pancreatic duct, measuring approximately 6 mm in the pancreatic head. Correlate with endoscopy/ERCP for further evaluation.  Assessment & Plan:   This is a 73 y.o. female with history of chronic pain on opioids , family history of colon cancer ( brother and  sister) , CAD s/p PCI presented with abdominal pain , postprandial nausea vomiting unintentional weight loss  #Post prandial vomiting #Dilated CBD and PD  Patient appears to have CBD dilation on CT from 02/2023 which could be post reservoir effect due to previous cholecystectomy  Patient has chronic pain and is on opiods which can cause biliary duct dilation as well    Patient was seen at [facility] for loss of appetite 05/2024 and was started on mirtazapine   Although CT done yesterday is concerning for double duct sign given CBD and PD dilation and with advanced age, unintentional weight loss, family history of GI malignancy,  imperative to rule out underlying pancreatic or periampullary malignancy  Postprandial undigested food emesis also concerning for obstructive lesion, although no mass seen on recent CT  No concerns of cholangitis given normal white count, afebrile and normal liver enzymes   Recs:  Would recommend MRI abdomen MRCP Depending on results would need EGD/EUS PPI daily    Tylicia Sherman Faizan Rommel Hogston, MD Gastroenterology and Hepatology [facility]  This chart has been completed using [employer] Dictation software, and while attempts have been made to ensure accuracy , certain words and phrases may not be transcribed as intended

## 2024-11-09 NOTE — ED Notes (Signed)
 Pt ambulated to bathroom without assistance. Pt denies nausea or another emesis episode at this time.

## 2024-11-09 NOTE — ED Triage Notes (Addendum)
 Pt with c/o emesis since last Saturday. States she is unable to keep anything down. Pts husband reports he found pt lying on bathroom floor this morning but pt reports that she does not remember anything.

## 2024-11-09 NOTE — Progress Notes (Signed)
   11/09/24 1343  TOC Brief Assessment  Insurance and Status Reviewed  Patient has primary care physician Yes  Home environment has been reviewed Home  Prior level of function: Independent  Prior/Current Home Services No current home services  Social Drivers of Health Review SDOH reviewed no interventions necessary  Readmission risk has been reviewed Yes  Transition of care needs no transition of care needs at this time   Inpatient Care Manager (ICM) has reviewed patient and no ICM needs have been identified at this time. We will continue to monitor patient advancement through interdisciplinary progression rounds. If new patient transition needs arise, please place a ICM consult.

## 2024-11-09 NOTE — H&P (Signed)
 History and Physical    Patient: Kelly Sanchez DOB: 1951/01/30 DOA: 11/09/2024 DOS: the patient was seen and examined on 11/09/2024 PCP: Missy Mallie MATSU, MD  Patient coming from: Home  Chief Complaint:  Chief Complaint  Patient presents with   Emesis   Loss of Consciousness    HPI: Kelly Sanchez is a 73 y.o. female with history of with a history of CAD with stent, cholecystectomy, esophageal stricture, GI bleed, gastric ulcers, presenting with worsening intractable nausea and vomiting for the last few days.  Patient states that with every meal she ended up having vomiting afterwards since this weekend.  Admits to some nausea and epigastric pain, mostly from continued vomiting.  Denies any hematemesis, diarrhea, fever, shortness of breath, chest pain.  Patient states she has had an episode of this in the past many months ago but it self resolved on its own.  Upon evaluation emergency department, WBCs 11.7, potassium 2.9.  CT noting intra and extrahepatic biliary ductal dilation, as well as main pancreatic ductal dilation with no obvious mass or stricture noted.   Review of Systems: As mentioned in the history of present illness. All other systems reviewed and are negative. Past Medical History:  Diagnosis Date   Arthritis    Cancer [facility])    Past Surgical History:  Procedure Laterality Date   APPENDECTOMY     CARDIAC CATHETERIZATION     CATARACT EXTRACTION W/PHACO Right 07/07/2024   Procedure: PHACOEMULSIFICATION, CATARACT, WITH IOL INSERTION 18.02 01:28.7;  Surgeon: [name] Feliciano Hugger, MD;  Location: [facility] ORS;  Service: Ophthalmology;  Laterality: Right;   CATARACT EXTRACTION W/PHACO Left 07/17/2024   Procedure: PHACOEMULSIFICATION, CATARACT, WITH IOL INSERTION 16.27 01:19.1;  Surgeon: [name] Feliciano Hugger, MD;  Location: [facility];  Service: Ophthalmology;  Laterality: Left;   CHOLECYSTECTOMY     TOTAL KNEE ARTHROPLASTY Left    Social History:  reports  that she has quit smoking. Her smoking use included cigarettes. She has never used smokeless tobacco. She reports current drug use. Drug: Marijuana. She reports that she does not drink alcohol.  No Known Allergies  Family History  Problem Relation Age of Onset   Dementia Mother    Heart attack Father     Prior to Admission medications   Medication Sig Start Date End Date Taking? Authorizing Provider  aspirin EC 81 MG tablet Take 81 mg by mouth daily. Swallow whole.    [provider]  metoprolol succinate (TOPROL-XL) 25 MG 24 hr tablet Take 25 mg by mouth every morning. 12/04/21   [provider]  omeprazole  (PRILOSEC) 40 MG capsule Take 1 capsule (40 mg total) by mouth daily before breakfast. 09/19/21   Edman, Marsa PARAS, DO  oxyCODONE -acetaminophen  (PERCOCET/ROXICET) 5-325 MG tablet Take 1 tablet by mouth in the morning, at noon, and at bedtime.    [provider]  rosuvastatin  (CRESTOR ) 20 MG tablet Take 1 tablet (20 mg total) by mouth every evening. 04/07/22   Karamalegos, Marsa PARAS, DO  gabapentin (NEURONTIN) 300 MG capsule Take by mouth. 09/04/18 12/09/19  [provider]    Physical Exam:  Vitals:   11/09/24 0715 11/09/24 0821 11/09/24 0827 11/09/24 0900  BP:  113/62  127/73  Pulse: (!) 49 (!) 50  (!) 43  Resp: (!) 22 (!) 21    Temp:   97.9 F (36.6 C)   TempSrc:   Oral   SpO2: 98% 99%  98%  Weight:      Height:  GENERAL:  Alert, pleasant, no acute distress  HEENT:  EOMI CARDIOVASCULAR:  RRR, no murmurs appreciated RESPIRATORY:  Clear to auscultation, no wheezing, rales, or rhonchi GASTROINTESTINAL:  Soft, minimally tender, nondistended EXTREMITIES:  No LE edema bilaterally NEURO:  No new focal deficits appreciated SKIN:  No rashes noted PSYCH:  Appropriate mood and affect    Data Reviewed:  Results are pending, will review when available.  CT ABDOMEN PELVIS W CONTRAST Result Date: 11/09/2024 EXAM: CT ABDOMEN  AND PELVIS WITH CONTRAST 11/09/2024 05:09:00 AM TECHNIQUE: CT of the abdomen and pelvis was performed with the administration of 100 mL of iohexol (OMNIPAQUE) 300 MG/ML solution. Multiplanar reformatted images are provided for review. Automated exposure control, iterative reconstruction, and/or weight-based adjustment of the mA/kV was utilized to reduce the radiation dose to as low as reasonably achievable. COMPARISON: None available. CLINICAL HISTORY: Abdominal pain, acute, nonlocalized. FINDINGS: LOWER CHEST: No acute abnormality. LIVER: Moderate intrahepatic biliary ductal dilatation. GALLBLADDER AND BILE DUCTS: The patient is status post cholecystectomy. There is moderate extrahepatic biliary ductal dilatation. The common hepatic duct measures up to approximately 19 mm in diameter and the common bile duct measures up to approximately 13 mm in diameter. SPLEEN: No acute abnormality. PANCREAS: The main pancreatic duct is abnormally distended, measuring approximately 6 mm in diameter in the head of the pancreas. There is no clear obstructing mass, although a small mass or stricture at the sphincter of Oddi cannot be excluded. Correlation with endoscopy is recommended. ADRENAL GLANDS: No acute abnormality. KIDNEYS, URETERS AND BLADDER: No stones in the kidneys or ureters. No hydronephrosis. No perinephric or periureteral stranding. Urinary bladder is unremarkable. GI AND BOWEL: Stomach demonstrates no acute abnormality. There is no bowel obstruction. PERITONEUM AND RETROPERITONEUM: No ascites. No free air. VASCULATURE: Aorta is normal in caliber. There is mild-to-moderate calcific atheromatous disease within the abdominal aorta. LYMPH NODES: No lymphadenopathy. REPRODUCTIVE ORGANS: The patient is status post hysterectomy and bilateral salpingo-oophorectomy. BONES AND SOFT TISSUES: No acute osseous abnormality. No focal soft tissue abnormality. IMPRESSION: 1. Moderate intrahepatic and extrahepatic biliary ductal  dilatation, with the common hepatic duct measuring up to 19 mm and the common bile duct measuring up to 13 mm; no clear obstructing mass identified, though a small mass or stricture at the sphincter of Oddi cannot be excluded. Correlate with endoscopy. 2. Abnormal dilation of the main pancreatic duct, measuring approximately 6 mm in the pancreatic head. Correlate with endoscopy/ERCP for further evaluation. Electronically signed by: Evalene Coho MD 11/09/2024 05:22 AM EST RP Workstation: [id number]   CT CERVICAL SPINE WO CONTRAST Result Date: 11/09/2024 EXAM: CT CERVICAL SPINE WITHOUT CONTRAST 11/09/2024 05:09:00 AM TECHNIQUE: CT of the cervical spine was performed without the administration of intravenous contrast. Multiplanar reformatted images are provided for review. Automated exposure control, iterative reconstruction, and/or weight based adjustment of the mA/kV was utilized to reduce the radiation dose to as low as reasonably achievable. COMPARISON: None available. CLINICAL HISTORY: Neck trauma (Age >= 65y). FINDINGS: CERVICAL SPINE: BONES AND ALIGNMENT: No acute fracture or traumatic malalignment. There is slight degenerative anterolisthesis at C4-C5. DEGENERATIVE CHANGES: Disc space narrowing and mild uncovertebral joint hypertrophy present at C5-C6 and C6-C7. Mild diffuse bilateral facet arthrosis. SOFT TISSUES: No prevertebral soft tissue swelling. IMPRESSION: 1. No acute abnormality of the cervical spine related to the provided clinical history of neck trauma 2. Slight degenerative anterolisthesis at C4-5 3. Disc space narrowing and mild uncovertebral joint hypertrophy at C5-6 and C6-7 4. Mild diffuse bilateral facet arthrosis Electronically signed by:  Evalene Coho MD 11/09/2024 05:17 AM EST RP Workstation: [id number]   CT HEAD WO CONTRAST ( ) Result Date: 11/09/2024 EXAM: CT HEAD WITHOUT CONTRAST 11/09/2024 05:09:00 AM TECHNIQUE: CT of the head was performed without the administration of  intravenous contrast. Automated exposure control, iterative reconstruction, and/or weight based adjustment of the mA/kV was utilized to reduce the radiation dose to as low as reasonably achievable. COMPARISON: CT of the head dated 05/17/2021. CLINICAL HISTORY: Head trauma, moderate-severe. FINDINGS: BRAIN AND VENTRICLES: No acute hemorrhage. No evidence of acute infarct. No hydrocephalus. No extra-axial collection. No mass effect or midline shift. ORBITS: No acute abnormality. SINUSES: No acute abnormality. SOFT TISSUES AND SKULL: No acute soft tissue abnormality. No skull fracture. IMPRESSION: 1. No acute intracranial abnormality. Electronically signed by: Evalene Coho MD 11/09/2024 05:15 AM EST RP Workstation: [id number]    Assessment and Plan:  Intractable nausea and vomiting/pancreatic and intra/extrahepatic ductal dilation - Etiology unclear as no obvious stone or blockage, appears to be located right at the sphincter of Oddi.  Consultation with GI, looking at imaging requesting EGD with EUS.  IV fluids, antiemetics on board.  Patient subsequently be transition to [facility] for further GI evaluation.  Hypokalemia - Potassium 2.9 on presentation.  IV replenishment initiated.  Can recheck BMP and mag in AM.  Continue on telemetry.  Volume depletion - Likely secondary to GI loss.  Received IV fluid bolus.  LR at 75 mL/h initiated.  Bradycardia - Does not appear to be symptomatic.  May be secondary to volume depletion and beta-blocker usage.  Hold beta-blockers for now.  Continue on telemetry.  Normocytic anemia - Will order iron studies, reticulocyte, B12, folate.  No active bleeding appreciated.  Recheck CBC in AM.  Advance Care Planning:   Code Status: Full Code   Consults: Gastroenterology  Family Communication: None at bedside  Severity of Illness: The appropriate patient status for this patient is INPATIENT. Inpatient status is judged to be reasonable and necessary in order to  provide the required intensity of service to ensure the patient's safety. The patient's presenting symptoms, physical exam findings, and initial radiographic and laboratory data in the context of their chronic comorbidities is felt to place them at high risk for further clinical deterioration. Furthermore, it is not anticipated that the patient will be medically stable for discharge from the hospital within 2 midnights of admission.   * I certify that at the point of admission it is my clinical judgment that the patient will require inpatient hospital care spanning beyond 2 midnights from the point of admission due to high intensity of service, high risk for further deterioration and high frequency of surveillance required.*  Author: Carliss LELON Canales, DO 11/09/2024 10:10 AM  For on call review [email or web address].

## 2024-11-09 NOTE — ED Provider Notes (Signed)
  Physical Exam  BP 116/68   Pulse (!) 49   Temp 98.1 F (36.7 C) (Oral)   Resp (!) 22   Ht 5' 2 (1.575 m)   Wt 58 kg   SpO2 98%   BMI 23.39 kg/m   Physical Exam  Procedures  Procedures  ED Course / MDM   Clinical Course as of 11/09/24 0747  Sun Nov 09, 2024  0704 Assumed care from Dr Theadore. 72 yo F on relevant PMH who presented with N/V and epigastric pain x1 week. Normal LFTs. CT shows double duct sign. Needs admission for ERCP and EUS because of the double duct sign on CT scan.  GI here (Dr. Cinderella) defers to [facility] because EUS cannot be performed here. One concern is pancreatic cancer vs stone vs stricture. Awaiting call back from [facility] at [facility].  [RP]  [phone] Discussed with Dr. Kristie from GI.  She does not perform ERCPs and will have Dr. Rollin give us  a callback shortly. [RP]  0745 Discussed with Dr. Rollin who agrees that patient should be transferred to [facility] and will perform EGD.  Discussed with hospitalist here at [facility] who will place admission orders to [facility].  [RP]    Clinical Course User Index [RP] Yolande Lamar BROCKS, MD   Medical Decision Making Amount and/or Complexity of Data Reviewed Radiology: ordered.  Risk Prescription drug management. Decision regarding hospitalization.       Yolande Lamar BROCKS, MD 11/09/24 [phone]

## 2024-11-10 ENCOUNTER — Inpatient Hospital Stay (HOSPITAL_COMMUNITY)

## 2024-11-10 ENCOUNTER — Telehealth: Payer: Self-pay | Admitting: Gastroenterology

## 2024-11-10 DIAGNOSIS — K8689 Other specified diseases of pancreas: Secondary | ICD-10-CM | POA: Diagnosis not present

## 2024-11-10 DIAGNOSIS — K838 Other specified diseases of biliary tract: Secondary | ICD-10-CM | POA: Diagnosis not present

## 2024-11-10 DIAGNOSIS — R112 Nausea with vomiting, unspecified: Secondary | ICD-10-CM | POA: Diagnosis not present

## 2024-11-10 DIAGNOSIS — Z743 Need for continuous supervision: Secondary | ICD-10-CM | POA: Diagnosis not present

## 2024-11-10 DIAGNOSIS — R1013 Epigastric pain: Secondary | ICD-10-CM | POA: Diagnosis not present

## 2024-11-10 DIAGNOSIS — R001 Bradycardia, unspecified: Secondary | ICD-10-CM | POA: Diagnosis not present

## 2024-11-10 DIAGNOSIS — R1084 Generalized abdominal pain: Secondary | ICD-10-CM | POA: Diagnosis not present

## 2024-11-10 LAB — COMPREHENSIVE METABOLIC PANEL WITH GFR
ALT: 13 U/L (ref 0–44)
AST: 23 U/L (ref 15–41)
Albumin: 3.8 g/dL (ref 3.5–5.0)
Alkaline Phosphatase: 86 U/L (ref 38–126)
Anion gap: 8 (ref 5–15)
BUN: 6 mg/dL — ABNORMAL LOW (ref 8–23)
CO2: 25 mmol/L (ref 22–32)
Calcium: 8.6 mg/dL — ABNORMAL LOW (ref 8.9–10.3)
Chloride: 110 mmol/L (ref 98–111)
Creatinine, Ser: 1.01 mg/dL — ABNORMAL HIGH (ref 0.44–1.00)
GFR, Estimated: 58 mL/min — ABNORMAL LOW (ref >60)
Glucose, Bld: 86 mg/dL (ref 70–99)
Potassium: 3.2 mmol/L — ABNORMAL LOW (ref 3.5–5.1)
Sodium: 144 mmol/L (ref 135–145)
Total Bilirubin: 0.4 mg/dL (ref 0.0–1.2)
Total Protein: 6 g/dL — ABNORMAL LOW (ref 6.5–8.1)

## 2024-11-10 LAB — CBC
HCT: 32.7 % — ABNORMAL LOW (ref 36.0–46.0)
Hemoglobin: 10.7 g/dL — ABNORMAL LOW (ref 12.0–15.0)
MCH: 30.6 pg (ref 26.0–34.0)
MCHC: 32.7 g/dL (ref 30.0–36.0)
MCV: 93.4 fL (ref 80.0–100.0)
Platelets: 148 K/uL — ABNORMAL LOW (ref 150–400)
RBC: 3.5 MIL/uL — ABNORMAL LOW (ref 3.87–5.11)
RDW: 14.3 % (ref 11.5–15.5)
WBC: 4.5 K/uL (ref 4.0–10.5)
nRBC: 0 % (ref 0.0–0.2)

## 2024-11-10 MED ORDER — PANTOPRAZOLE SODIUM 40 MG PO TBEC
40.0000 mg | DELAYED_RELEASE_TABLET | Freq: Every day | ORAL | Status: DC
  Administered 2024-11-10 – 2024-11-11 (×2): 40 mg via ORAL
  Filled 2024-11-10 (×2): qty 1

## 2024-11-10 MED ORDER — SUCRALFATE 1 G PO TABS: 1.0000 g | ORAL_TABLET | Freq: Four times a day (QID) | ORAL | 0 refills | Status: AC

## 2024-11-10 MED ORDER — ONDANSETRON HCL 4 MG PO TABS: 4.0000 mg | ORAL_TABLET | Freq: Four times a day (QID) | ORAL | 0 refills | Status: AC | PRN

## 2024-11-10 MED ORDER — POTASSIUM CHLORIDE 20 MEQ PO PACK
40.0000 meq | PACK | Freq: Two times a day (BID) | ORAL | Status: AC
  Administered 2024-11-10 (×2): 40 meq via ORAL
  Filled 2024-11-10 (×2): qty 2

## 2024-11-10 MED ORDER — GADOBUTROL 1 MMOL/ML IV SOLN
5.0000 mL | Freq: Once | INTRAVENOUS | Status: AC | PRN
  Administered 2024-11-10: 5 mL via INTRAVENOUS

## 2024-11-10 MED ORDER — METOPROLOL SUCCINATE ER 25 MG PO TB24
12.5000 mg | ORAL_TABLET | Freq: Every day | ORAL | Status: DC
  Filled 2024-11-10: qty 1

## 2024-11-10 NOTE — Hospital Course (Signed)
 73 y.o. female with history of with a history of CAD with stent, cholecystectomy, esophageal stricture, GI bleed, gastric ulcers, presenting with worsening intractable nausea and vomiting for the last few days.  Patient states that with every meal she ended up having vomiting afterwards since this weekend.  Admits to some nausea and epigastric pain, mostly from continued vomiting.  Denies any hematemesis, diarrhea, fever, shortness of breath, chest pain.  Patient states she has had an episode of this in the past many months ago but it self resolved on its own.   Upon evaluation emergency department, WBCs 11.7, potassium 2.9.  CT noting intra and extrahepatic biliary ductal dilation, as well as main pancreatic ductal dilation with no obvious mass or stricture noted.  Assessment and Plan:   Intractable nausea and vomiting/pancreatic and intra/extrahepatic ductal dilation - Etiology unclear as no obvious stone or blockage, appears to be located right at the sphincter of Oddi.  Consultation with GI, looking at imaging requesting EGD with EUS.  IV fluids, antiemetics on board.  Patient subsequently be transition to [facility] for further GI evaluation.   Hypokalemia - Potassium 2.9 on presentation.  IV replenishment initiated.  Can recheck BMP and mag in AM.  Continue on telemetry.   Volume depletion - Likely secondary to GI loss.  Received IV fluid bolus.  LR at 75 mL/h initiated.   Bradycardia - Does not appear to be symptomatic.  May be secondary to volume depletion and beta-blocker usage.  Hold beta-blockers for now.  Continue on telemetry.   Normocytic anemia - Will order iron studies, reticulocyte, B12, folate.  No active bleeding appreciated.  Recheck CBC in AM.

## 2024-11-10 NOTE — Progress Notes (Signed)
  Cross Cover Note   Patient: Kelly Sanchez FMW:[id number] DOB: 08-01-1951 DOA: 11/09/2024  DOS: the patient was seen and examined on 11/10/2024    Patient was to be discharged home after MRCP came back negative.  However unfortunately pending transfer to [facility] interrupted her disposition.  Upon arrival to [facility] called the patient in the room to explain her benign MRCP and previous chart findings suggesting her ductal dilation is chronic.  Patient was obviously upset about learning she was being discharged just after arrival.  However the patient did explain she is grateful that there is nothing big causing her symptoms but does state that she is still having vomiting and hesitancy with eating secondary to her symptoms.  Initially this morning was on board with going home and following up with additional testing in the outpatient setting.  However had attempted medications and food afterwards and had emesis and feels she would like another day to attempt eating in a hospital setting.  She is scared she would have to come right back to the hospital if her symptoms returned.  After discussion with the patient, came to consensus to rescind her discharge for now.  Will reach back out to GI to visit her to answer questions about what other further testing/imaging can be done in the inpatient versus outpatient setting.  She was appreciative of taking the time to discuss her planning and care.

## 2024-11-10 NOTE — Care Management CC44 (Signed)
 Condition Code 44 Documentation Completed  Patient Details  Name: Kelly Sanchez MRN: [id number] Date of Birth: May 25, 1951   Condition Code 44 given:  Yes Patient signature on Condition Code 44 notice:  Yes Documentation of 2 MD's agreement:  Yes Code 44 added to claim:  Yes    Stephane Powell Jansky, RN 11/10/2024, 3:16 PM

## 2024-11-10 NOTE — ED Notes (Signed)
 Patient transported to MRI

## 2024-11-10 NOTE — Care Management Obs Status (Signed)
 MEDICARE OBSERVATION STATUS NOTIFICATION   Patient Details  Name: Kelly Sanchez MRN: [id number] Date of Birth: 1951/03/18   Medicare Observation Status Notification Given:  Yes    Stephane Powell Jansky, RN 11/10/2024, 3:16 PM

## 2024-11-10 NOTE — Progress Notes (Signed)
 Patient arrived to [facility] from [facility] with discharge order. Patient vital signs taken and patient settled. Attending provider messaged and provider called patient.

## 2024-11-10 NOTE — Telephone Encounter (Signed)
 Noted

## 2024-11-10 NOTE — Telephone Encounter (Signed)
 Kelly Sanchez/Kelly Sanchez/Kelly Sanchez/Kelly Sanchez:  We need to arrange an outpatient EGD with Dr. Cinderella due to N/V, abdominal pain, weight loss. MRCP negative. Hopefully discharged tomorrow from [facility].   If possible, please arrange in next few weeks, ASA 2, any room.   EGD +/- dilation.   She and her support person use MyChart and you can communicate on there. Thank you!  Therisa

## 2024-11-10 NOTE — Discharge Summary (Addendum)
 Physician Discharge Summary   Patient: Kelly Sanchez MRN: [id number] DOB: [birth year]  Admit date:     11/09/2024  Discharge date: 11/10/24  Discharge Physician: Carliss LELON Canales   PCP: Missy Mallie MATSU, MD   Recommendations at discharge:   Addendum: Discharge rescinded for now.  See cross cover note above.   Pt to be discharged home.   If you experience worsening fever, chills, chest pain, shortness of breath, or other concerning symptoms, please call your PCP or go to the emergency department immediately.  Discharge Diagnoses: Principal Problem:   Intractable nausea and vomiting Active Problems:   Hypokalemia due to excessive gastrointestinal loss of potassium   Volume depletion, gastrointestinal loss   Sinus bradycardia   Normocytic anemia   Common bile duct dilation   Dilation of pancreatic duct   Epigastric pain   Dilation of biliary tract  Resolved Problems:   * No resolved hospital problems. *   Hospital Course:  73 y.o. female with history of with a history of CAD with stent, cholecystectomy, esophageal stricture, GI bleed, gastric ulcers, presenting with worsening intractable nausea and vomiting for the last few days.  Patient states that with every meal she ended up having vomiting afterwards since this weekend.  Admits to some nausea and epigastric pain, mostly from continued vomiting.  Denies any hematemesis, diarrhea, fever, shortness of breath, chest pain.  Patient states she has had an episode of this in the past many months ago but it self resolved on its own.   Upon evaluation emergency department, WBCs 11.7, potassium 2.9.  CT noting intra and extrahepatic biliary ductal dilation, as well as main pancreatic ductal dilation with no obvious mass or stricture noted.  Assessment and Plan:   Intractable nausea and vomiting/pancreatic and intra/extrahepatic ductal dilation - Etiology unclear as no obvious stone or blockage, appears to be located right at the sphincter  of Oddi.  Consultation with GI, initially looking at imaging requesting EGD with EUS.  IV fluids, antiemetics on board.  Was originally to be transition to [facility] however no beds available.  MRCP performed here showing no acute pathology or inflammation.  Ductal dilation thought to be normal postsurgical changes.  Chart reviewed noting similar presentation and imaging findings in 2022.  Patient's nausea and vomiting have resolved.  Requesting diet.  Labs largely unremarkable.  Okay to discharge home.  Can follow-up with GI in the outpatient setting.  Possibly to pursue further imaging or EUS.  Hypokalemia - Potassium 2.9 on presentation.  Improved after replenishment.   Volume depletion - Likely secondary to GI loss.  Received IV fluid bolus.  LR at 75 mL/h initiated.  Appears resolved.   Bradycardia - Does not appear to be symptomatic.  May be secondary to volume depletion and beta-blocker usage.  Recommending discontinuing beta-blocker upon discharge.  Can follow-up with cardiology in the outpatient setting.   Normocytic anemia - iron studies, reticulocyte, B12, unremarkable.  Mildly low folate.  No active bleeding appreciated.     Consultants: Gastroenterology Procedures performed: None Disposition: Home Diet recommendation:  Discharge Diet Orders (From admission, onward)     Start     Ordered   11/10/24 0000  Diet - low sodium heart healthy        11/10/24 1356           Cardiac diet  DISCHARGE MEDICATION: Allergies as of 11/10/2024   No Known Allergies      Medication List     STOP taking  these medications    metoprolol succinate 25 MG 24 hr tablet Commonly known as: TOPROL-XL       TAKE these medications    aspirin EC 81 MG tablet Take 81 mg by mouth at bedtime. Swallow whole.   loratadine 10 MG tablet Commonly known as: CLARITIN Take 10 mg by mouth daily.   omeprazole  40 MG capsule Commonly known as: PRILOSEC Take 1 capsule (40 mg total) by mouth  daily before breakfast.   ondansetron  4 MG tablet Commonly known as: ZOFRAN  Take 1 tablet (4 mg total) by mouth every 6 (six) hours as needed for nausea.   oxyCODONE -acetaminophen  10-325 MG tablet Commonly known as: PERCOCET Take 1 tablet by mouth in the morning, at noon, and at bedtime.   rosuvastatin  40 MG tablet Commonly known as: CRESTOR  Take 40 mg by mouth at bedtime.   sucralfate 1 g tablet Commonly known as: CARAFATE Take 1 tablet (1 g total) by mouth 4 (four) times daily.         Discharge Exam: Filed Weights   11/09/24 0353  Weight: 58 kg    GENERAL:  Alert, pleasant, no acute distress  HEENT:  EOMI CARDIOVASCULAR:  RRR, no murmurs appreciated RESPIRATORY:  Clear to auscultation, no wheezing, rales, or rhonchi GASTROINTESTINAL:  Soft, nontender, nondistended EXTREMITIES:  No LE edema bilaterally NEURO:  No new focal deficits appreciated SKIN:  No rashes noted PSYCH:  Appropriate mood and affect     Condition at discharge: improving  The results of significant diagnostics from this hospitalization (including imaging, microbiology, ancillary and laboratory) are listed below for reference.   Imaging Studies: MR ABDOMEN MRCP W WO CONTAST Result Date: 11/10/2024 EXAM: MRCP WITH AND WITHOUT IV CONTRAST 11/10/2024 11:19:53 AM TECHNIQUE: Multisequence, multiplanar magnetic resonance images of the abdomen with and without intravenous contrast. MRCP sequences were performed. 5 mL (gadobutrol (GADAVIST) 1 MMOL/ML injection 5 mL GADOBUTROL 1 MMOL/ML IV SOLN) was administered. COMPARISON: CT and ultrasound 11/09/2024. CLINICAL HISTORY: Biliary obstruction suspected (Ped 0-17y); concern for pancreatic neoplasm. FINDINGS: LIVER: No discrete hepatic lesion. Postcontrast enhanced imaging demonstrates no enhancing hepatic lesion. GALLBLADDER AND BILIARY SYSTEM: Post cholecystectomy. The common hepatic duct and common bile duct are dilated to 10 mm. The common bile duct tapers  normally to the ampulla (image 14 of series 5). There is no filling defect within the distal common bile duct on T2 weighted imaging. No significant intrahepatic duct dilatation. No evidence of choledocholithiasis. SPLEEN: Unremarkable. PANCREAS/PANCREATIC DUCT: Mild dilatation of the distal pancreatic duct at the ampulla. No dilatation in the body or tail of the pancreas. No pancreatic mass lesion identified. Postcontrast enhanced imaging demonstrates no pancreatic lesion. No evidence of pancreatic mass lesion or inflammation. ADRENAL GLANDS: Unremarkable. KIDNEYS: Unremarkable. LYMPH NODES: No enlarged abdominal lymph nodes. VASCULATURE: Unremarkable. PERITONEUM: No ascites. ABDOMINAL WALL: No hernia. No mass. BOWEL: Grossly unremarkable. No bowel obstruction. BONES: T2 bright lesions in the lumbar spine are favored benign hemangiomas. SOFT TISSUES: Unremarkable. MISCELLANEOUS: Unremarkable. IMPRESSION: 1. No evidence of choledocholithiasis. Dilatation of the common bile duct is favored post-cholecystectomy physiology. 2. No evidence of pancreatic mass lesion or inflammation. Electronically signed by: Norleen Boxer MD 11/10/2024 12:05 PM EST RP Workstation: [id number]   MR 3D Recon At Scanner Result Date: 11/10/2024 EXAM: MRCP WITH AND WITHOUT IV CONTRAST 11/10/2024 11:19:53 AM TECHNIQUE: Multisequence, multiplanar magnetic resonance images of the abdomen with and without intravenous contrast. MRCP sequences were performed. 5 mL (gadobutrol (GADAVIST) 1 MMOL/ML injection 5 mL GADOBUTROL 1 MMOL/ML IV SOLN)  was administered. COMPARISON: CT and ultrasound 11/09/2024. CLINICAL HISTORY: Biliary obstruction suspected (Ped 0-17y); concern for pancreatic neoplasm. FINDINGS: LIVER: No discrete hepatic lesion. Postcontrast enhanced imaging demonstrates no enhancing hepatic lesion. GALLBLADDER AND BILIARY SYSTEM: Post cholecystectomy. The common hepatic duct and common bile duct are dilated to 10 mm. The common bile duct  tapers normally to the ampulla (image 14 of series 5). There is no filling defect within the distal common bile duct on T2 weighted imaging. No significant intrahepatic duct dilatation. No evidence of choledocholithiasis. SPLEEN: Unremarkable. PANCREAS/PANCREATIC DUCT: Mild dilatation of the distal pancreatic duct at the ampulla. No dilatation in the body or tail of the pancreas. No pancreatic mass lesion identified. Postcontrast enhanced imaging demonstrates no pancreatic lesion. No evidence of pancreatic mass lesion or inflammation. ADRENAL GLANDS: Unremarkable. KIDNEYS: Unremarkable. LYMPH NODES: No enlarged abdominal lymph nodes. VASCULATURE: Unremarkable. PERITONEUM: No ascites. ABDOMINAL WALL: No hernia. No mass. BOWEL: Grossly unremarkable. No bowel obstruction. BONES: T2 bright lesions in the lumbar spine are favored benign hemangiomas. SOFT TISSUES: Unremarkable. MISCELLANEOUS: Unremarkable. IMPRESSION: 1. No evidence of choledocholithiasis. Dilatation of the common bile duct is favored post-cholecystectomy physiology. 2. No evidence of pancreatic mass lesion or inflammation. Electronically signed by: Norleen Boxer MD 11/10/2024 12:05 PM EST RP Workstation: [id number]   US  Abdomen Limited RUQ (LIVER/GB) Result Date: 11/09/2024 CLINICAL DATA:  Previous cholecystectomy. Possible choledocholithiasis on CT today. EXAM: ULTRASOUND ABDOMEN LIMITED RIGHT UPPER QUADRANT COMPARISON:  CT abdomen/pelvis today. FINDINGS: Gallbladder: Prior cholecystectomy. Common bile duct: Diameter: 1.5 cm unchanged from CT earlier today. No evidence of intraductal mass or choledocholithiasis. Liver: No focal lesion identified. Within normal limits in parenchymal echogenicity. Portal vein is patent on color Doppler imaging with normal direction of blood flow towards the liver. Other: No free fluid. IMPRESSION: 1. No acute findings. 2. Prior cholecystectomy. 3. Dilated common bile duct measuring 1.5 cm unchanged from CT earlier  today. No evidence of intraductal mass or choledocholithiasis. Electronically Signed   By: Toribio Agreste M.D.   On: 11/09/2024 10:11   CT ABDOMEN PELVIS W CONTRAST Result Date: 11/09/2024 EXAM: CT ABDOMEN AND PELVIS WITH CONTRAST 11/09/2024 05:09:00 AM TECHNIQUE: CT of the abdomen and pelvis was performed with the administration of 100 mL of iohexol (OMNIPAQUE) 300 MG/ML solution. Multiplanar reformatted images are provided for review. Automated exposure control, iterative reconstruction, and/or weight-based adjustment of the mA/kV was utilized to reduce the radiation dose to as low as reasonably achievable. COMPARISON: None available. CLINICAL HISTORY: Abdominal pain, acute, nonlocalized. FINDINGS: LOWER CHEST: No acute abnormality. LIVER: Moderate intrahepatic biliary ductal dilatation. GALLBLADDER AND BILE DUCTS: The patient is status post cholecystectomy. There is moderate extrahepatic biliary ductal dilatation. The common hepatic duct measures up to approximately 19 mm in diameter and the common bile duct measures up to approximately 13 mm in diameter. SPLEEN: No acute abnormality. PANCREAS: The main pancreatic duct is abnormally distended, measuring approximately 6 mm in diameter in the head of the pancreas. There is no clear obstructing mass, although a small mass or stricture at the sphincter of Oddi cannot be excluded. Correlation with endoscopy is recommended. ADRENAL GLANDS: No acute abnormality. KIDNEYS, URETERS AND BLADDER: No stones in the kidneys or ureters. No hydronephrosis. No perinephric or periureteral stranding. Urinary bladder is unremarkable. GI AND BOWEL: Stomach demonstrates no acute abnormality. There is no bowel obstruction. PERITONEUM AND RETROPERITONEUM: No ascites. No free air. VASCULATURE: Aorta is normal in caliber. There is mild-to-moderate calcific atheromatous disease within the abdominal aorta. LYMPH NODES: No lymphadenopathy. REPRODUCTIVE  ORGANS: The patient is status post  hysterectomy and bilateral salpingo-oophorectomy. BONES AND SOFT TISSUES: No acute osseous abnormality. No focal soft tissue abnormality. IMPRESSION: 1. Moderate intrahepatic and extrahepatic biliary ductal dilatation, with the common hepatic duct measuring up to 19 mm and the common bile duct measuring up to 13 mm; no clear obstructing mass identified, though a small mass or stricture at the sphincter of Oddi cannot be excluded. Correlate with endoscopy. 2. Abnormal dilation of the main pancreatic duct, measuring approximately 6 mm in the pancreatic head. Correlate with endoscopy/ERCP for further evaluation. Electronically signed by: Evalene Coho MD 11/09/2024 05:22 AM EST RP Workstation: [id number]   CT CERVICAL SPINE WO CONTRAST Result Date: 11/09/2024 EXAM: CT CERVICAL SPINE WITHOUT CONTRAST 11/09/2024 05:09:00 AM TECHNIQUE: CT of the cervical spine was performed without the administration of intravenous contrast. Multiplanar reformatted images are provided for review. Automated exposure control, iterative reconstruction, and/or weight based adjustment of the mA/kV was utilized to reduce the radiation dose to as low as reasonably achievable. COMPARISON: None available. CLINICAL HISTORY: Neck trauma (Age >= 65y). FINDINGS: CERVICAL SPINE: BONES AND ALIGNMENT: No acute fracture or traumatic malalignment. There is slight degenerative anterolisthesis at C4-C5. DEGENERATIVE CHANGES: Disc space narrowing and mild uncovertebral joint hypertrophy present at C5-C6 and C6-C7. Mild diffuse bilateral facet arthrosis. SOFT TISSUES: No prevertebral soft tissue swelling. IMPRESSION: 1. No acute abnormality of the cervical spine related to the provided clinical history of neck trauma 2. Slight degenerative anterolisthesis at C4-5 3. Disc space narrowing and mild uncovertebral joint hypertrophy at C5-6 and C6-7 4. Mild diffuse bilateral facet arthrosis Electronically signed by: Evalene Coho MD 11/09/2024 05:17 AM EST  RP Workstation: [id number]   CT HEAD WO CONTRAST ( ) Result Date: 11/09/2024 EXAM: CT HEAD WITHOUT CONTRAST 11/09/2024 05:09:00 AM TECHNIQUE: CT of the head was performed without the administration of intravenous contrast. Automated exposure control, iterative reconstruction, and/or weight based adjustment of the mA/kV was utilized to reduce the radiation dose to as low as reasonably achievable. COMPARISON: CT of the head dated 05/17/2021. CLINICAL HISTORY: Head trauma, moderate-severe. FINDINGS: BRAIN AND VENTRICLES: No acute hemorrhage. No evidence of acute infarct. No hydrocephalus. No extra-axial collection. No mass effect or midline shift. ORBITS: No acute abnormality. SINUSES: No acute abnormality. SOFT TISSUES AND SKULL: No acute soft tissue abnormality. No skull fracture. IMPRESSION: 1. No acute intracranial abnormality. Electronically signed by: Evalene Coho MD 11/09/2024 05:15 AM EST RP Workstation: [id number]    Microbiology: Results for orders placed or performed in visit on 11/25/19  Novel Coronavirus, NAA ([facility])     Status: None   Collection Time: 11/25/19 10:25 AM   Specimen: Nasopharyngeal(NP) swabs in vial transport medium   NASOPHARYNGE  TESTING  Result Value Ref Range Status   SARS-CoV-2, NAA Not Detected Not Detected Final    Comment: This nucleic acid amplification test was developed and its performance characteristics determined by [employer]. Nucleic acid amplification tests include PCR and TMA. This test has not been FDA cleared or approved. This test has been authorized by FDA under an Emergency Use Authorization (EUA). This test is only authorized for the duration of time the declaration that circumstances exist justifying the authorization of the emergency use of in vitro diagnostic tests for detection of SARS-CoV-2 virus and/or diagnosis of COVID-19 infection under section 564(b)(1) of the Act, 21 U.S.C. 639aaa-6(a) (1), unless the authorization  is terminated or revoked sooner. When diagnostic testing is negative, the possibility of a false negative result should be considered in  the context of a patient's recent exposures and the presence of clinical signs and symptoms consistent with COVID-19. An individual without symptoms of COVID-19 and who is not shedding SARS-CoV-2 virus would  expect to have a negative (not detected) result in this assay.     Labs: CBC: Recent Labs  Lab 11/09/24 0355 11/10/24 0339  WBC 6.5 4.5  HGB 11.7* 10.7*  HCT 34.9* 32.7*  MCV 92.1 93.4  PLT 160 148*   Basic Metabolic Panel: Recent Labs  Lab 11/09/24 0355 11/10/24 0339  NA 143 144  K 2.9* 3.2*  CL 107 110  CO2 25 25  GLUCOSE 103* 86  BUN 8 6*  CREATININE 1.10* 1.01*  CALCIUM  9.1 8.6*   Liver Function Tests: Recent Labs  Lab 11/09/24 0355 11/10/24 0339  AST 30 23  ALT 17 13  ALKPHOS 100 86  BILITOT 0.5 0.4  PROT 7.1 6.0*  ALBUMIN 4.4 3.8   CBG: No results for input(s): GLUCAP in the last 168 hours.  Discharge time spent: 45 minutes.  Length of inpatient stay: 1 days  Signed: Carliss LELON Canales, DO [facility] 11/10/2024

## 2024-11-10 NOTE — Progress Notes (Addendum)
  73 y.o. female with a history of chronic pain on opioids , family history of colon cancer ( brother and sister) , CAD s/p PCI presented with abdominal pain , postprandial nausea vomiting unintentional weight loss, findings of chronically dilated CBD and abnormal dilation of main pancreatic duct. LFTs normal. MRI/MRCP completed today without evidence for choledocholithiasis and favored dilated CBD post-cholecystectomy physiology. No pancreatic mass lesion.   As she is doing well, she will be discharged and we will arrange outpatient follow-up with our office. Discussed with Dr. Arlon, hospitalist.   Therisa MICAEL Stager, PhD, ANP-BC [facility]    Patient ended up picked up by transport and now at [facility]. As MRCP is negative, there is no need for ERCP. She would benefit from an upper endoscopy, but this can be done as outpatient. I spoke with patient personally and informed her of this. No office visit needed prior to EGD. We will arrange expeditiously as outpatient with Dr. Cinderella. Discussed with Dr Arlon as well. Hold off on consulting GI at [facility], as there is nothing further to add. We can address this at her primary GI [facility]) as outpatient.  If she continues to have weight loss, postprandial abdominal pain, I would recommend CTA to evaluate mesenteric vasculature as well.  Plans for EGD at [facility] as outpatient. Hopeful discharge from [place] in next 24 hours.  Therisa MICAEL Stager, PhD, ANP-BC [facility]  4:12 PM

## 2024-11-10 NOTE — ED Notes (Signed)
 Patient is resting comfortably.

## 2024-11-11 DIAGNOSIS — R112 Nausea with vomiting, unspecified: Secondary | ICD-10-CM | POA: Diagnosis not present

## 2024-11-11 MED ORDER — METOCLOPRAMIDE HCL 5 MG PO TABS: 5.0000 mg | ORAL_TABLET | Freq: Four times a day (QID) | ORAL | 0 refills | Status: AC | PRN

## 2024-11-11 MED ORDER — LORATADINE 10 MG PO TABS
10.0000 mg | ORAL_TABLET | Freq: Every day | ORAL | Status: DC
  Administered 2024-11-11: 10 mg via ORAL
  Filled 2024-11-11: qty 1

## 2024-11-11 MED ORDER — ASPIRIN 81 MG PO TBEC: 81.0000 mg | DELAYED_RELEASE_TABLET | Freq: Every day | ORAL | Status: DC

## 2024-11-11 MED ORDER — POLYETHYLENE GLYCOL 3350 17 G PO PACK
17.0000 g | PACK | Freq: Two times a day (BID) | ORAL | Status: DC
  Administered 2024-11-11: 17 g via ORAL
  Filled 2024-11-11: qty 1

## 2024-11-11 MED ORDER — METOCLOPRAMIDE HCL 5 MG/ML IJ SOLN
5.0000 mg | Freq: Once | INTRAMUSCULAR | Status: AC
  Administered 2024-11-11: 5 mg via INTRAVENOUS
  Filled 2024-11-11: qty 2

## 2024-11-11 MED ORDER — ROSUVASTATIN CALCIUM 20 MG PO TABS: 40.0000 mg | ORAL_TABLET | Freq: Every day | ORAL | Status: DC

## 2024-11-11 NOTE — Telephone Encounter (Signed)
Called, no answer and VM not set up

## 2024-11-11 NOTE — TOC Transition Note (Signed)
 Transition of Care [facility]) - Discharge Note   Patient Details  Name: Kelly Sanchez MRN: [id number] Date of Birth: 1951/03/03  [facility]) CM/SW Contact:  Tom-Johnson, Harvest Muskrat, RN Phone Number: 11/11/2024, 12:47 PM   Clinical Narrative:     Patient is scheduled for discharge today.  Readmission Risk Assessment done. Outpatient referral, hospital f/u and discharge instructions on AVS. No ICM needs or recommendations noted. Significant other, Brent at bedside and will transport at discharge.  No further ICM needs noted.       Final next level of care: Home/Self Care Barriers to Discharge: Barriers Resolved   Patient Goals and CMS Choice Patient states their goals for this hospitalization and ongoing recovery are:: To return home CMS Medicare.gov Compare Post Acute Care list provided to:: Patient Choice offered to / list presented to : NA      Discharge Placement                Patient to be transferred to facility by: Significant Other Name of family member notified: [facility]    Discharge Plan and Services Additional resources added to the After Visit Summary for                  DME Arranged: N/A DME Agency: NA       HH Arranged: NA HH Agency: NA        Social Drivers of Health (SDOH) Interventions SDOH Screenings   Food Insecurity: No Food Insecurity (11/10/2024)  Housing: Low Risk  (11/10/2024)  Transportation Needs: No Transportation Needs (11/10/2024)  Utilities: Not At Risk (11/10/2024)  Depression (PHQ2-9): Medium Risk (02/07/2022)  Financial Resource Strain: Low Risk (08/18/2020)   Received from [facility]  Social Connections: Socially Isolated (11/10/2024)  Tobacco Use: Medium Risk (11/09/2024)     Readmission Risk Interventions    11/11/2024   12:46 PM  Readmission Risk Prevention Plan  Transportation Screening Complete  PCP or Specialist Appt within 5-7 Days Complete  Home Care Screening Complete  Medication  Review (RN CM) Referral to Pharmacy

## 2024-11-11 NOTE — Discharge Summary (Signed)
 Physician Discharge Summary  KIOWA PEIFER FMW:[id number] DOB: Dec 01, 1951 DOA: 11/09/2024  PCP: Missy Mallie MATSU, MD  Admit date: 11/09/2024 Discharge date: 11/11/2024  Admitted From:  Discharge disposition: home   Recommendations for Outpatient Follow-Up:   Outpatient GI follow up: ? Gastropareisis or constipation-- PRN reglan for now along with aggressive bowel regimen If not improved, consideration of CTA-- former smoker BB held due to bradycardia and hypotension-- outpatient follow up Cbc/bmp at next office visit   Discharge Diagnosis:   Principal Problem:   Intractable nausea and vomiting Active Problems:   Hypokalemia due to excessive gastrointestinal loss of potassium   Volume depletion, gastrointestinal loss   Sinus bradycardia   Normocytic anemia   Common bile duct dilation   Dilation of pancreatic duct   Epigastric pain   Dilation of biliary tract    Discharge Condition: Improved.  Diet recommendation: soft diet  Wound care: None.  Code status: Full.   History of Present Illness:   Kelly Sanchez is a 73 y.o. female with history of with a history of CAD with stent, cholecystectomy, esophageal stricture, GI bleed, gastric ulcers, presenting with worsening intractable nausea and vomiting for the last few days.  Patient states that with every meal she ended up having vomiting afterwards since this weekend.  Admits to some nausea and epigastric pain, mostly from continued vomiting.  Denies any hematemesis, diarrhea, fever, shortness of breath, chest pain.  Patient states she has had an episode of this in the past many months ago but it self resolved on its own.   Upon evaluation emergency department, WBCs 11.7, potassium 2.9.  CT noting intra and extrahepatic biliary ductal dilation, as well as main pancreatic ductal dilation with no obvious mass or stricture noted.     Hospital Course by Problem:   Intractable nausea and vomiting/pancreatic and  intra/extrahepatic ductal dilation - Etiology unclear as no obvious stone or blockage, appears to be located right at the sphincter of Oddi.  MRCP performed here showing no acute pathology or inflammation.  Ductal dilation thought to be normal postsurgical changes.  Chart reviewed noting similar presentation and imaging findings in 2022.   -improved with reglan -on constipating meds-- will use aggressive bowel regimen   Hypokalemia - repleted   Volume depletion - resolved   Bradycardia - Does not appear to be symptomatic.  May be secondary to volume depletion and beta-blocker usage.  Recommending discontinuing beta-blocker upon discharge.  Can follow-up with cardiology in the outpatient setting.   Normocytic anemia - iron studies, reticulocyte, B12, unremarkable.  Mildly low folate.  No active bleeding appreciated.      Medical Consultants:   GI   Discharge Exam:   Vitals:   11/11/24 0537 11/11/24 0838  BP: 126/60 (!) 109/51  Pulse: (!) 44 66  Resp: 17 19  Temp: 97.8 F (36.6 C) 98.2 F (36.8 C)  SpO2: 100% 97%   Vitals:   11/10/24 2012 11/10/24 2221 11/11/24 0537 11/11/24 0838  BP: (!) 112/59 115/61 126/60 (!) 109/51  Pulse: (!) 51 (!) 48 (!) 44 66  Resp: 17  17 19   Temp: 98.5 F (36.9 C)  97.8 F (36.6 C) 98.2 F (36.8 C)  TempSrc: Oral  Oral Oral  SpO2: 98% 97% 100% 97%  Weight:      Height:        General exam: Appears calm and comfortable.    The results of significant diagnostics from this hospitalization (including imaging, microbiology,  ancillary and laboratory) are listed below for reference.     Procedures and Diagnostic Studies:   MR ABDOMEN MRCP W WO CONTAST Result Date: 11/10/2024 EXAM: MRCP WITH AND WITHOUT IV CONTRAST 11/10/2024 11:19:53 AM TECHNIQUE: Multisequence, multiplanar magnetic resonance images of the abdomen with and without intravenous contrast. MRCP sequences were performed. 5 mL (gadobutrol (GADAVIST) 1 MMOL/ML injection 5 mL  GADOBUTROL 1 MMOL/ML IV SOLN) was administered. COMPARISON: CT and ultrasound 11/09/2024. CLINICAL HISTORY: Biliary obstruction suspected (Ped 0-17y); concern for pancreatic neoplasm. FINDINGS: LIVER: No discrete hepatic lesion. Postcontrast enhanced imaging demonstrates no enhancing hepatic lesion. GALLBLADDER AND BILIARY SYSTEM: Post cholecystectomy. The common hepatic duct and common bile duct are dilated to 10 mm. The common bile duct tapers normally to the ampulla (image 14 of series 5). There is no filling defect within the distal common bile duct on T2 weighted imaging. No significant intrahepatic duct dilatation. No evidence of choledocholithiasis. SPLEEN: Unremarkable. PANCREAS/PANCREATIC DUCT: Mild dilatation of the distal pancreatic duct at the ampulla. No dilatation in the body or tail of the pancreas. No pancreatic mass lesion identified. Postcontrast enhanced imaging demonstrates no pancreatic lesion. No evidence of pancreatic mass lesion or inflammation. ADRENAL GLANDS: Unremarkable. KIDNEYS: Unremarkable. LYMPH NODES: No enlarged abdominal lymph nodes. VASCULATURE: Unremarkable. PERITONEUM: No ascites. ABDOMINAL WALL: No hernia. No mass. BOWEL: Grossly unremarkable. No bowel obstruction. BONES: T2 bright lesions in the lumbar spine are favored benign hemangiomas. SOFT TISSUES: Unremarkable. MISCELLANEOUS: Unremarkable. IMPRESSION: 1. No evidence of choledocholithiasis. Dilatation of the common bile duct is favored post-cholecystectomy physiology. 2. No evidence of pancreatic mass lesion or inflammation. Electronically signed by: Norleen Boxer MD 11/10/2024 12:05 PM EST RP Workstation: [id number]   MR 3D Recon At Scanner Result Date: 11/10/2024 EXAM: MRCP WITH AND WITHOUT IV CONTRAST 11/10/2024 11:19:53 AM TECHNIQUE: Multisequence, multiplanar magnetic resonance images of the abdomen with and without intravenous contrast. MRCP sequences were performed. 5 mL (gadobutrol (GADAVIST) 1 MMOL/ML  injection 5 mL GADOBUTROL 1 MMOL/ML IV SOLN) was administered. COMPARISON: CT and ultrasound 11/09/2024. CLINICAL HISTORY: Biliary obstruction suspected (Ped 0-17y); concern for pancreatic neoplasm. FINDINGS: LIVER: No discrete hepatic lesion. Postcontrast enhanced imaging demonstrates no enhancing hepatic lesion. GALLBLADDER AND BILIARY SYSTEM: Post cholecystectomy. The common hepatic duct and common bile duct are dilated to 10 mm. The common bile duct tapers normally to the ampulla (image 14 of series 5). There is no filling defect within the distal common bile duct on T2 weighted imaging. No significant intrahepatic duct dilatation. No evidence of choledocholithiasis. SPLEEN: Unremarkable. PANCREAS/PANCREATIC DUCT: Mild dilatation of the distal pancreatic duct at the ampulla. No dilatation in the body or tail of the pancreas. No pancreatic mass lesion identified. Postcontrast enhanced imaging demonstrates no pancreatic lesion. No evidence of pancreatic mass lesion or inflammation. ADRENAL GLANDS: Unremarkable. KIDNEYS: Unremarkable. LYMPH NODES: No enlarged abdominal lymph nodes. VASCULATURE: Unremarkable. PERITONEUM: No ascites. ABDOMINAL WALL: No hernia. No mass. BOWEL: Grossly unremarkable. No bowel obstruction. BONES: T2 bright lesions in the lumbar spine are favored benign hemangiomas. SOFT TISSUES: Unremarkable. MISCELLANEOUS: Unremarkable. IMPRESSION: 1. No evidence of choledocholithiasis. Dilatation of the common bile duct is favored post-cholecystectomy physiology. 2. No evidence of pancreatic mass lesion or inflammation. Electronically signed by: Norleen Boxer MD 11/10/2024 12:05 PM EST RP Workstation: [id number]   US  Abdomen Limited RUQ (LIVER/GB) Result Date: 11/09/2024 CLINICAL DATA:  Previous cholecystectomy. Possible choledocholithiasis on CT today. EXAM: ULTRASOUND ABDOMEN LIMITED RIGHT UPPER QUADRANT COMPARISON:  CT abdomen/pelvis today. FINDINGS: Gallbladder: Prior cholecystectomy. Common  bile duct: Diameter: 1.5 cm  unchanged from CT earlier today. No evidence of intraductal mass or choledocholithiasis. Liver: No focal lesion identified. Within normal limits in parenchymal echogenicity. Portal vein is patent on color Doppler imaging with normal direction of blood flow towards the liver. Other: No free fluid. IMPRESSION: 1. No acute findings. 2. Prior cholecystectomy. 3. Dilated common bile duct measuring 1.5 cm unchanged from CT earlier today. No evidence of intraductal mass or choledocholithiasis. Electronically Signed   By: Toribio Agreste M.D.   On: 11/09/2024 10:11   CT ABDOMEN PELVIS W CONTRAST Result Date: 11/09/2024 EXAM: CT ABDOMEN AND PELVIS WITH CONTRAST 11/09/2024 05:09:00 AM TECHNIQUE: CT of the abdomen and pelvis was performed with the administration of 100 mL of iohexol (OMNIPAQUE) 300 MG/ML solution. Multiplanar reformatted images are provided for review. Automated exposure control, iterative reconstruction, and/or weight-based adjustment of the mA/kV was utilized to reduce the radiation dose to as low as reasonably achievable. COMPARISON: None available. CLINICAL HISTORY: Abdominal pain, acute, nonlocalized. FINDINGS: LOWER CHEST: No acute abnormality. LIVER: Moderate intrahepatic biliary ductal dilatation. GALLBLADDER AND BILE DUCTS: The patient is status post cholecystectomy. There is moderate extrahepatic biliary ductal dilatation. The common hepatic duct measures up to approximately 19 mm in diameter and the common bile duct measures up to approximately 13 mm in diameter. SPLEEN: No acute abnormality. PANCREAS: The main pancreatic duct is abnormally distended, measuring approximately 6 mm in diameter in the head of the pancreas. There is no clear obstructing mass, although a small mass or stricture at the sphincter of Oddi cannot be excluded. Correlation with endoscopy is recommended. ADRENAL GLANDS: No acute abnormality. KIDNEYS, URETERS AND BLADDER: No stones in the kidneys or  ureters. No hydronephrosis. No perinephric or periureteral stranding. Urinary bladder is unremarkable. GI AND BOWEL: Stomach demonstrates no acute abnormality. There is no bowel obstruction. PERITONEUM AND RETROPERITONEUM: No ascites. No free air. VASCULATURE: Aorta is normal in caliber. There is mild-to-moderate calcific atheromatous disease within the abdominal aorta. LYMPH NODES: No lymphadenopathy. REPRODUCTIVE ORGANS: The patient is status post hysterectomy and bilateral salpingo-oophorectomy. BONES AND SOFT TISSUES: No acute osseous abnormality. No focal soft tissue abnormality. IMPRESSION: 1. Moderate intrahepatic and extrahepatic biliary ductal dilatation, with the common hepatic duct measuring up to 19 mm and the common bile duct measuring up to 13 mm; no clear obstructing mass identified, though a small mass or stricture at the sphincter of Oddi cannot be excluded. Correlate with endoscopy. 2. Abnormal dilation of the main pancreatic duct, measuring approximately 6 mm in the pancreatic head. Correlate with endoscopy/ERCP for further evaluation. Electronically signed by: Evalene Coho MD 11/09/2024 05:22 AM EST RP Workstation: [id number]   CT CERVICAL SPINE WO CONTRAST Result Date: 11/09/2024 EXAM: CT CERVICAL SPINE WITHOUT CONTRAST 11/09/2024 05:09:00 AM TECHNIQUE: CT of the cervical spine was performed without the administration of intravenous contrast. Multiplanar reformatted images are provided for review. Automated exposure control, iterative reconstruction, and/or weight based adjustment of the mA/kV was utilized to reduce the radiation dose to as low as reasonably achievable. COMPARISON: None available. CLINICAL HISTORY: Neck trauma (Age >= 65y). FINDINGS: CERVICAL SPINE: BONES AND ALIGNMENT: No acute fracture or traumatic malalignment. There is slight degenerative anterolisthesis at C4-C5. DEGENERATIVE CHANGES: Disc space narrowing and mild uncovertebral joint hypertrophy present at C5-C6 and  C6-C7. Mild diffuse bilateral facet arthrosis. SOFT TISSUES: No prevertebral soft tissue swelling. IMPRESSION: 1. No acute abnormality of the cervical spine related to the provided clinical history of neck trauma 2. Slight degenerative anterolisthesis at C4-5 3. Disc space narrowing and  mild uncovertebral joint hypertrophy at C5-6 and C6-7 4. Mild diffuse bilateral facet arthrosis Electronically signed by: Evalene Coho MD 11/09/2024 05:17 AM EST RP Workstation: [id number]   CT HEAD WO CONTRAST ( ) Result Date: 11/09/2024 EXAM: CT HEAD WITHOUT CONTRAST 11/09/2024 05:09:00 AM TECHNIQUE: CT of the head was performed without the administration of intravenous contrast. Automated exposure control, iterative reconstruction, and/or weight based adjustment of the mA/kV was utilized to reduce the radiation dose to as low as reasonably achievable. COMPARISON: CT of the head dated 05/17/2021. CLINICAL HISTORY: Head trauma, moderate-severe. FINDINGS: BRAIN AND VENTRICLES: No acute hemorrhage. No evidence of acute infarct. No hydrocephalus. No extra-axial collection. No mass effect or midline shift. ORBITS: No acute abnormality. SINUSES: No acute abnormality. SOFT TISSUES AND SKULL: No acute soft tissue abnormality. No skull fracture. IMPRESSION: 1. No acute intracranial abnormality. Electronically signed by: Evalene Coho MD 11/09/2024 05:15 AM EST RP Workstation: [id number]     Labs:   Basic Metabolic Panel: Recent Labs  Lab 11/09/24 0355 11/10/24 0339  NA 143 144  K 2.9* 3.2*  CL 107 110  CO2 25 25  GLUCOSE 103* 86  BUN 8 6*  CREATININE 1.10* 1.01*  CALCIUM  9.1 8.6*   GFR Estimated Creatinine Clearance: 39.2 mL/min (A) (by C-G formula based on SCr of 1.01 mg/dL (H)). Liver Function Tests: Recent Labs  Lab 11/09/24 0355 11/10/24 0339  AST 30 23  ALT 17 13  ALKPHOS 100 86  BILITOT 0.5 0.4  PROT 7.1 6.0*  ALBUMIN 4.4 3.8   Recent Labs  Lab 11/09/24 0355  LIPASE 55*   No results  for input(s): AMMONIA in the last 168 hours. Coagulation profile No results for input(s): INR, PROTIME in the last 168 hours.  CBC: Recent Labs  Lab 11/09/24 0355 11/10/24 0339  WBC 6.5 4.5  HGB 11.7* 10.7*  HCT 34.9* 32.7*  MCV 92.1 93.4  PLT 160 148*   Cardiac Enzymes: No results for input(s): CKTOTAL, CKMB, CKMBINDEX, TROPONINI in the last 168 hours. BNP: Invalid input(s): POCBNP CBG: No results for input(s): GLUCAP in the last 168 hours. D-Dimer No results for input(s): DDIMER in the last 72 hours. Hgb A1c No results for input(s): HGBA1C in the last 72 hours. Lipid Profile No results for input(s): CHOL, HDL, LDLCALC, TRIG, CHOLHDL, LDLDIRECT in the last 72 hours. Thyroid  function studies No results for input(s): TSH, T4TOTAL, T3FREE, THYROIDAB in the last 72 hours.  Invalid input(s): FREET3 Anemia work up Recent Labs    11/09/24 0355 11/09/24 1041  VITAMINB12 1,147*  --   FOLATE  --  8.6  FERRITIN 33  --   TIBC 391  --   IRON 42  --   RETICCTPCT 1.4  --    Microbiology No results found for this or any previous visit (from the past 240 hours).   Discharge Instructions:   Discharge Instructions     Ambulatory referral to Gastroenterology   Complete by: As directed    What is the reason for referral?: Other Comment - EUS?   Call MD for:  persistant nausea and vomiting   Complete by: As directed    Call MD for:  redness, tenderness, or signs of infection (pain, swelling, redness, odor or green/yellow discharge around incision site)   Complete by: As directed    Call MD for:  temperature >100.4   Complete by: As directed    Diet - low sodium heart healthy   Complete by: As directed    Diet general  Complete by: As directed    Discharge instructions   Complete by: As directed    Bowel regimen to avoid constipation-- miralax, senna/docusate etc   Increase activity slowly   Complete by: As directed     Increase activity slowly   Complete by: As directed       Allergies as of 11/11/2024   No Known Allergies      Medication List     STOP taking these medications    metoprolol succinate 25 MG 24 hr tablet Commonly known as: TOPROL-XL       TAKE these medications    aspirin EC 81 MG tablet Take 81 mg by mouth at bedtime. Swallow whole.   B-12 PO Take 1 drop by mouth daily.   loratadine 10 MG tablet Commonly known as: CLARITIN Take 10 mg by mouth daily.   metoCLOPramide 5 MG tablet Commonly known as: Reglan Take 1 tablet (5 mg total) by mouth every 6 (six) hours as needed for refractory nausea / vomiting.   omeprazole  40 MG capsule Commonly known as: PRILOSEC Take 1 capsule (40 mg total) by mouth daily before breakfast.   ondansetron  4 MG tablet Commonly known as: ZOFRAN  Take 1 tablet (4 mg total) by mouth every 6 (six) hours as needed for nausea.   oxyCODONE -acetaminophen  10-325 MG tablet Commonly known as: PERCOCET Take 1 tablet by mouth in the morning, at noon, and at bedtime.   rosuvastatin  40 MG tablet Commonly known as: CRESTOR  Take 40 mg by mouth at bedtime.   sucralfate 1 g tablet Commonly known as: CARAFATE Take 1 tablet (1 g total) by mouth 4 (four) times daily.        Follow-up Information     Missy Mallie MATSU, MD Follow up in 1 week(s).   Specialty: Family Medicine Contact information: [address] [place] [address] [phone]                  Time coordinating discharge: 45 min  Signed:  Harlene RAYMOND Bowl DO  [facility] 11/11/2024, 12:43 PM

## 2024-11-12 NOTE — Telephone Encounter (Signed)
 Received VM from pt, called back and no answer and VM not set up

## 2024-11-13 NOTE — Telephone Encounter (Signed)
 ATC, no answer and no VM. Letter mailed

## 2024-12-27 ENCOUNTER — Emergency Department
Admission: EM | Admit: 2024-12-27 | Discharge: 2024-12-27 | Disposition: A | Discharge status: Home / Self Care | Source: Home / Self Care

## 2024-12-27 ENCOUNTER — Other Ambulatory Visit: Payer: Self-pay

## 2024-12-27 DIAGNOSIS — R1110 Vomiting, unspecified: Secondary | ICD-10-CM | POA: Insufficient documentation

## 2024-12-27 DIAGNOSIS — Z5321 Procedure and treatment not carried out due to patient leaving prior to being seen by health care provider: Secondary | ICD-10-CM | POA: Diagnosis not present

## 2024-12-27 DIAGNOSIS — T364X5A Adverse effect of tetracyclines, initial encounter: Secondary | ICD-10-CM | POA: Diagnosis not present

## 2024-12-27 HISTORY — DX: Atherosclerotic heart disease of native coronary artery without angina pectoris: I25.10

## 2024-12-27 LAB — COMPREHENSIVE METABOLIC PANEL WITH GFR
ALT: 22 U/L (ref 0–44)
AST: 34 U/L (ref 15–41)
Albumin: 4.4 g/dL (ref 3.5–5.0)
Alkaline Phosphatase: 104 U/L (ref 38–126)
Anion gap: 13 (ref 5–15)
BUN: 15 mg/dL (ref 8–23)
CO2: 21 mmol/L — ABNORMAL LOW (ref 22–32)
Calcium: 9.3 mg/dL (ref 8.9–10.3)
Chloride: 107 mmol/L (ref 98–111)
Creatinine, Ser: 1.34 mg/dL — ABNORMAL HIGH (ref 0.44–1.00)
GFR, Estimated: 42 mL/min — ABNORMAL LOW
Glucose, Bld: 109 mg/dL — ABNORMAL HIGH (ref 70–99)
Potassium: 3.9 mmol/L (ref 3.5–5.1)
Sodium: 140 mmol/L (ref 135–145)
Total Bilirubin: 0.3 mg/dL (ref 0.0–1.2)
Total Protein: 7.3 g/dL (ref 6.5–8.1)

## 2024-12-27 LAB — CBC
HCT: 36.8 % (ref 36.0–46.0)
Hemoglobin: 12.1 g/dL (ref 12.0–15.0)
MCH: 29.7 pg (ref 26.0–34.0)
MCHC: 32.9 g/dL (ref 30.0–36.0)
MCV: 90.4 fL (ref 80.0–100.0)
Platelets: 113 K/uL — ABNORMAL LOW (ref 150–400)
RBC: 4.07 MIL/uL (ref 3.87–5.11)
RDW: 13.4 % (ref 11.5–15.5)
WBC: 5.5 K/uL (ref 4.0–10.5)
nRBC: 0 % (ref 0.0–0.2)

## 2024-12-27 LAB — LIPASE, BLOOD: Lipase: 86 U/L — ABNORMAL HIGH (ref 11–51)

## 2024-12-27 NOTE — ED Triage Notes (Signed)
 Pt to ED with husband for 3-4 episodes of vomiting and some abdominal pain after taking first dose of doxycycline today which was to treat sinus infection. Was not vomiting or having abdominal pain until taking the doxy. Respirations are unlabored.

## 2025-01-09 NOTE — Progress Notes (Signed)
 Patient fell and possibly hit her head and was having vomiting, CT head to evaluate for intracranial bleeding.  Should have documented this more clearly, thank you.  Kelly Sanchez. [place], MD [facility] [email or web address]
